# Patient Record
Sex: Female | Born: 1950 | Race: White | Hispanic: No | Marital: Married | Smoking: Never smoker
Health system: Southern US, Community
[De-identification: ages and names within clinical notes are randomized; demographics above are authoritative.]

## PROBLEM LIST (undated history)

## (undated) DIAGNOSIS — I10 Essential (primary) hypertension: Secondary | ICD-10-CM

## (undated) DIAGNOSIS — E785 Hyperlipidemia, unspecified: Secondary | ICD-10-CM

## (undated) DIAGNOSIS — K635 Polyp of colon: Secondary | ICD-10-CM

## (undated) DIAGNOSIS — K219 Gastro-esophageal reflux disease without esophagitis: Secondary | ICD-10-CM

## (undated) DIAGNOSIS — T7840XA Allergy, unspecified, initial encounter: Secondary | ICD-10-CM

## (undated) DIAGNOSIS — K8020 Calculus of gallbladder without cholecystitis without obstruction: Secondary | ICD-10-CM

## (undated) DIAGNOSIS — K5792 Diverticulitis of intestine, part unspecified, without perforation or abscess without bleeding: Secondary | ICD-10-CM

## (undated) DIAGNOSIS — M1990 Unspecified osteoarthritis, unspecified site: Secondary | ICD-10-CM

## (undated) DIAGNOSIS — Z8489 Family history of other specified conditions: Secondary | ICD-10-CM

## (undated) DIAGNOSIS — J189 Pneumonia, unspecified organism: Secondary | ICD-10-CM

## (undated) HISTORY — PX: TUBAL LIGATION: SHX77

## (undated) HISTORY — DX: Allergy, unspecified, initial encounter: T78.40XA

## (undated) HISTORY — DX: Unspecified osteoarthritis, unspecified site: M19.90

## (undated) HISTORY — DX: Polyp of colon: K63.5

## (undated) HISTORY — PX: NECK SURGERY: SHX720

## (undated) HISTORY — PX: POLYPECTOMY: SHX149

## (undated) HISTORY — DX: Gastro-esophageal reflux disease without esophagitis: K21.9

## (undated) HISTORY — PX: CHOLECYSTECTOMY: SHX55

## (undated) HISTORY — DX: Hyperlipidemia, unspecified: E78.5

## (undated) HISTORY — DX: Calculus of gallbladder without cholecystitis without obstruction: K80.20

## (undated) HISTORY — DX: Pneumonia, unspecified organism: J18.9

## (undated) HISTORY — DX: Essential (primary) hypertension: I10

## (undated) HISTORY — PX: CERVICAL SPINE SURGERY: SHX589

## (undated) HISTORY — PX: COLONOSCOPY: SHX174

---

## 1998-03-31 ENCOUNTER — Inpatient Hospital Stay (HOSPITAL_COMMUNITY): Admission: RE | Admit: 1998-03-31 | Discharge: 1998-04-01 | Payer: Self-pay | Admitting: Neurosurgery

## 1999-12-29 ENCOUNTER — Encounter: Payer: Self-pay | Admitting: Gastroenterology

## 1999-12-29 ENCOUNTER — Ambulatory Visit (HOSPITAL_COMMUNITY): Admission: RE | Admit: 1999-12-29 | Discharge: 1999-12-29 | Payer: Self-pay | Admitting: Gastroenterology

## 2000-01-04 ENCOUNTER — Ambulatory Visit (HOSPITAL_COMMUNITY): Admission: RE | Admit: 2000-01-04 | Discharge: 2000-01-04 | Payer: Self-pay | Admitting: Gastroenterology

## 2000-01-04 ENCOUNTER — Encounter (INDEPENDENT_AMBULATORY_CARE_PROVIDER_SITE_OTHER): Payer: Self-pay | Admitting: Specialist

## 2000-01-20 ENCOUNTER — Encounter (INDEPENDENT_AMBULATORY_CARE_PROVIDER_SITE_OTHER): Payer: Self-pay | Admitting: Specialist

## 2000-01-20 ENCOUNTER — Other Ambulatory Visit: Admission: RE | Admit: 2000-01-20 | Discharge: 2000-01-20 | Payer: Self-pay | Admitting: Surgery

## 2001-11-15 ENCOUNTER — Other Ambulatory Visit: Admission: RE | Admit: 2001-11-15 | Discharge: 2001-11-15 | Payer: Self-pay | Admitting: Obstetrics and Gynecology

## 2001-11-22 ENCOUNTER — Ambulatory Visit (HOSPITAL_COMMUNITY): Admission: RE | Admit: 2001-11-22 | Discharge: 2001-11-22 | Payer: Self-pay | Admitting: Obstetrics and Gynecology

## 2001-11-22 ENCOUNTER — Encounter: Payer: Self-pay | Admitting: Obstetrics and Gynecology

## 2003-09-24 ENCOUNTER — Other Ambulatory Visit: Admission: RE | Admit: 2003-09-24 | Discharge: 2003-09-24 | Payer: Self-pay | Admitting: Obstetrics and Gynecology

## 2003-11-02 ENCOUNTER — Ambulatory Visit (HOSPITAL_COMMUNITY): Admission: RE | Admit: 2003-11-02 | Discharge: 2003-11-02 | Payer: Self-pay | Admitting: Obstetrics and Gynecology

## 2004-06-03 ENCOUNTER — Encounter: Admission: RE | Admit: 2004-06-03 | Discharge: 2004-06-03 | Payer: Self-pay | Admitting: Family Medicine

## 2005-02-27 ENCOUNTER — Ambulatory Visit: Payer: Self-pay | Admitting: Family Medicine

## 2005-03-02 ENCOUNTER — Other Ambulatory Visit: Admission: RE | Admit: 2005-03-02 | Discharge: 2005-03-02 | Payer: Self-pay | Admitting: Obstetrics and Gynecology

## 2005-03-20 ENCOUNTER — Ambulatory Visit (HOSPITAL_COMMUNITY): Admission: RE | Admit: 2005-03-20 | Discharge: 2005-03-20 | Payer: Self-pay | Admitting: Obstetrics and Gynecology

## 2006-03-16 ENCOUNTER — Other Ambulatory Visit: Admission: RE | Admit: 2006-03-16 | Discharge: 2006-03-16 | Payer: Self-pay | Admitting: Obstetrics and Gynecology

## 2006-03-21 ENCOUNTER — Ambulatory Visit (HOSPITAL_COMMUNITY): Admission: RE | Admit: 2006-03-21 | Discharge: 2006-03-21 | Payer: Self-pay | Admitting: Obstetrics and Gynecology

## 2007-04-05 ENCOUNTER — Ambulatory Visit (HOSPITAL_COMMUNITY): Admission: RE | Admit: 2007-04-05 | Discharge: 2007-04-05 | Payer: Self-pay | Admitting: Family Medicine

## 2009-04-15 ENCOUNTER — Ambulatory Visit (HOSPITAL_COMMUNITY): Admission: RE | Admit: 2009-04-15 | Discharge: 2009-04-15 | Payer: Self-pay | Admitting: Family Medicine

## 2010-06-13 ENCOUNTER — Ambulatory Visit (HOSPITAL_COMMUNITY): Admission: RE | Admit: 2010-06-13 | Discharge: 2010-06-13 | Payer: Self-pay | Admitting: Obstetrics and Gynecology

## 2011-01-13 NOTE — Procedures (Signed)
[facility]  Patient:    Courtney Schmidt, Courtney Schmidt                        MRN: [id number] Adm. Date:  [id number] Disc. Date: [id number] Attending:  Deneen Harts CC:         Gilford Rile. Benedetto Goad, M.D. [facility]             Velora Heckler, M.D.                           Procedure Report  PROCEDURE:  Panendoscopy with biopsy.  ENDOSCOPIST:  Griffith Citron, M.D.  INDICATION:  [age]-year-old white female, undergoing endoscopy to evaluate epigastric pain.  Evaluation has revealed a chronically inflamed-appearing gallbladder with thickening on ultrasound with biliary sludge.  Undergoing endoscopy to further evaluate epigastric pain prior to anticipated cholecystectomy.  Patient has already seen Dr. Velora Heckler and cholecystectomy has been scheduled.  DESCRIPTION OF PROCEDURE:  After reviewing the nature of the procedure with the patient, including potential risks and complications, and after discussing alternative methods of diagnosis and treatment, informed consent was signed.  Patient was premedicated, receiving IV sedation totalling Versed 5 mg and Fentanyl 50 mcg, administered in divided doses prior to the onset of the procedure.  Using an Olympus videoendoscope, proximal esophagus intubated under direct vision.  Normal oropharynx.  No lesion of the epiglottis, vocal cords or piriform sinus.  The proximal, mid and distal segments of the esophagus were normal.  Mucosal Z-line distinct at 40 cm.  Scope advanced into the stomach. Gastric fundus, body and antrum were normal, pylorus symmetric, duodenal bulb and second portion normal.  Retroflexed view of the angularis, lesser curve, gastric cardia and fundus negative except for one polyp, 5 mm, just beneath the GE junction.  This was biopsied and submitted for routine pathology.  No additional lesion was identified.  Stomach was decompressed; scope withdrawn.  Patient tolerated the procedure without  difficulty, being maintained on digiscope monitor and low-flow oxygen throughout.  Returned to recovery in stable condition.  ASSESSMENT: 1. Gastric fundal polyp -- diminutive, benign, biopsy pending. 2. No lesion to explain abdominal pain, suspect gallbladder etiology.  RECOMMENDATION: 1. Proceed with cholecystectomy. 2. Follow up pathology. DD:  01/04/00 TD:  01/06/00 Job: [id number]

## 2011-05-23 ENCOUNTER — Other Ambulatory Visit (HOSPITAL_COMMUNITY): Payer: Self-pay | Admitting: Family Medicine

## 2011-05-23 DIAGNOSIS — Z1231 Encounter for screening mammogram for malignant neoplasm of breast: Secondary | ICD-10-CM

## 2011-06-19 ENCOUNTER — Ambulatory Visit (HOSPITAL_COMMUNITY)
Admission: RE | Admit: 2011-06-19 | Discharge: 2011-06-19 | Disposition: A | Discharge status: Home / Self Care | Payer: 59 | Source: Ambulatory Visit | Attending: Family Medicine | Admitting: Family Medicine

## 2011-06-19 DIAGNOSIS — Z1231 Encounter for screening mammogram for malignant neoplasm of breast: Secondary | ICD-10-CM | POA: Insufficient documentation

## 2011-11-20 ENCOUNTER — Encounter (INDEPENDENT_AMBULATORY_CARE_PROVIDER_SITE_OTHER): Payer: 59 | Admitting: Obstetrics and Gynecology

## 2011-11-20 DIAGNOSIS — R87610 Atypical squamous cells of undetermined significance on cytologic smear of cervix (ASC-US): Secondary | ICD-10-CM

## 2012-05-15 ENCOUNTER — Encounter: Payer: 59 | Admitting: Obstetrics and Gynecology

## 2012-05-23 ENCOUNTER — Encounter: Payer: Self-pay | Admitting: Obstetrics and Gynecology

## 2012-05-23 ENCOUNTER — Ambulatory Visit (INDEPENDENT_AMBULATORY_CARE_PROVIDER_SITE_OTHER): Payer: 59 | Admitting: Obstetrics and Gynecology

## 2012-05-23 VITALS — BP 110/72 | Ht 62.0 in | Wt 164.0 lb

## 2012-05-23 DIAGNOSIS — N8111 Cystocele, midline: Secondary | ICD-10-CM

## 2012-05-23 DIAGNOSIS — IMO0002 Reserved for concepts with insufficient information to code with codable children: Secondary | ICD-10-CM

## 2012-05-23 DIAGNOSIS — Z124 Encounter for screening for malignant neoplasm of cervix: Secondary | ICD-10-CM

## 2012-05-23 DIAGNOSIS — N800 Endometriosis of uterus: Secondary | ICD-10-CM | POA: Insufficient documentation

## 2012-05-23 DIAGNOSIS — Z01419 Encounter for gynecological examination (general) (routine) without abnormal findings: Secondary | ICD-10-CM

## 2012-05-23 DIAGNOSIS — I10 Essential (primary) hypertension: Secondary | ICD-10-CM | POA: Insufficient documentation

## 2012-05-23 NOTE — Progress Notes (Signed)
The patient is not taking hormone replacement therapy The patient  is taking a Calcium supplement. Post-menopausal bleeding:no  Last Pap: was abnormal: ascus March  2012 Last mammogram: was normal October  2012 Last DEXA scan : T= -0.29 April 2010 Last colonoscopy:normal a year ago per pt   Urinary symptoms: non Normal bowel movements: Yes Reports abuse at home: No:  Pt stated that she has something coming out of her vagina?  Subjective:    Courtney Schmidt is a 61 y.o. female No obstetric history on file. who presents for annual exam.    The following portions of the patient's history were reviewed and updated as appropriate: allergies, current medications, past family history, past medical history, past social history, past surgical history and problem list.  Review of Systems Pertinent items are noted in HPI. Gastrointestinal:No change in bowel habits, no abdominal pain, no rectal bleeding Genitourinary:negative for dysuria, frequency, hematuria, nocturia and urinary incontinence    Objective:     BP 110/72  Ht 5\' 2"  (1.575 m)  Wt 164 lb (74.39 kg)  BMI 30.00 kg/m2  Weight:  Wt Readings from Last 1 Encounters:  05/23/12 164 lb (74.39 kg)     BMI: Body mass index is 30.00 kg/(m^2). General Appearance: Alert, appropriate appearance for age. No acute distress HEENT: Grossly normal Neck / Thyroid: Supple, no masses, nodes or enlargement Lungs: clear to auscultation bilaterally Back: No CVA tenderness Breast Exam: No masses or nodes.No dimpling, nipple retraction or discharge. Cardiovascular: Regular rate and rhythm. S1, S2, no murmur Gastrointestinal: Soft, non-tender, no masses or organomegaly Pelvic Exam: Vulva and vagina appear normal. Bimanual exam reveals normal uterus and adnexa. Cystocele 1/4 Rectovaginal: normal rectal, no masses Lymphatic Exam: Non-palpable nodes in neck, clavicular, axillary, or inguinal regions Skin: no rash or abnormalities Neurologic:  Normal gait and speech, no tremor  Psychiatric: Alert and oriented, appropriate affect.     Assessment:    Normal gyn exam  Mild cystocele   Plan:  reassurance mammogram pap smear return annually or prn Follow-up:  for annual exam

## 2012-05-27 LAB — PAP IG W/ RFLX HPV ASCU

## 2013-05-01 ENCOUNTER — Other Ambulatory Visit (HOSPITAL_COMMUNITY): Payer: Self-pay | Admitting: Family Medicine

## 2013-05-01 DIAGNOSIS — Z1231 Encounter for screening mammogram for malignant neoplasm of breast: Secondary | ICD-10-CM

## 2013-05-07 ENCOUNTER — Ambulatory Visit (HOSPITAL_COMMUNITY)
Admission: RE | Admit: 2013-05-07 | Discharge: 2013-05-07 | Disposition: A | Discharge status: Home / Self Care | Payer: 59 | Source: Ambulatory Visit | Attending: Family Medicine | Admitting: Family Medicine

## 2013-05-07 DIAGNOSIS — Z1231 Encounter for screening mammogram for malignant neoplasm of breast: Secondary | ICD-10-CM | POA: Insufficient documentation

## 2013-10-17 ENCOUNTER — Other Ambulatory Visit: Payer: Self-pay | Admitting: Podiatry

## 2013-10-17 MED ORDER — AMOXICILLIN-POT CLAVULANATE 500-125 MG PO TABS: 1.0000 | ORAL_TABLET | Freq: Three times a day (TID) | ORAL | Status: DC

## 2013-12-04 DIAGNOSIS — M79609 Pain in unspecified limb: Secondary | ICD-10-CM

## 2014-06-29 ENCOUNTER — Encounter: Payer: Self-pay | Admitting: Obstetrics and Gynecology

## 2014-09-10 ENCOUNTER — Other Ambulatory Visit (HOSPITAL_COMMUNITY): Payer: Self-pay | Admitting: Obstetrics and Gynecology

## 2014-09-10 DIAGNOSIS — Z1231 Encounter for screening mammogram for malignant neoplasm of breast: Secondary | ICD-10-CM

## 2014-09-17 ENCOUNTER — Ambulatory Visit (HOSPITAL_COMMUNITY)
Admission: RE | Admit: 2014-09-17 | Discharge: 2014-09-17 | Disposition: A | Discharge status: Home / Self Care | Payer: 59 | Source: Ambulatory Visit | Attending: Obstetrics and Gynecology | Admitting: Obstetrics and Gynecology

## 2014-09-17 DIAGNOSIS — Z1231 Encounter for screening mammogram for malignant neoplasm of breast: Secondary | ICD-10-CM | POA: Insufficient documentation

## 2015-04-29 ENCOUNTER — Telehealth: Payer: Self-pay | Admitting: Internal Medicine

## 2015-04-29 NOTE — Telephone Encounter (Signed)
Received GI records from Dr. Liliane Channel office and forwarded to Dr. Hilarie Fredrickson for review.

## 2015-05-10 ENCOUNTER — Encounter: Payer: Self-pay | Admitting: Internal Medicine

## 2015-07-01 ENCOUNTER — Ambulatory Visit (AMBULATORY_SURGERY_CENTER): Payer: Self-pay | Admitting: *Deleted

## 2015-07-01 VITALS — Ht 62.0 in | Wt 170.0 lb

## 2015-07-01 DIAGNOSIS — Z86010 Personal history of colonic polyps: Secondary | ICD-10-CM

## 2015-07-01 NOTE — Progress Notes (Signed)
No egg or soy allergy No issues with past sedation No diet pills No home 02 use   emmi video to e mail     [email or web address]

## 2015-07-15 ENCOUNTER — Encounter: Payer: Self-pay | Admitting: Internal Medicine

## 2015-07-15 ENCOUNTER — Ambulatory Visit (AMBULATORY_SURGERY_CENTER): Payer: 59 | Admitting: Internal Medicine

## 2015-07-15 VITALS — BP 118/75 | HR 63 | Temp 97.9°F | Resp 18 | Ht 62.0 in | Wt 170.0 lb

## 2015-07-15 DIAGNOSIS — Z86010 Personal history of colonic polyps: Secondary | ICD-10-CM

## 2015-07-15 DIAGNOSIS — D125 Benign neoplasm of sigmoid colon: Secondary | ICD-10-CM

## 2015-07-15 DIAGNOSIS — K635 Polyp of colon: Secondary | ICD-10-CM | POA: Diagnosis not present

## 2015-07-15 MED ORDER — SODIUM CHLORIDE 0.9 % IV SOLN: 500.0000 mL | INTRAVENOUS | Status: DC

## 2015-07-15 NOTE — Op Note (Signed)
[place]  [address] [place], [id number]   COLONOSCOPY PROCEDURE REPORT  PATIENT: Courtney Schmidt, Courtney Schmidt  MR#: [id number] BIRTHDATE: 1950-09-23 , 31  yrs. old GENDER: female ENDOSCOPIST: Jerene Bears, MD REFERRED [id number] Dema Severin, M.D. PROCEDURE DATE:  07/15/2015 PROCEDURE:   Colonoscopy, surveillance and Colonoscopy with snare polypectomy First Screening Colonoscopy - Avg.  risk and is 50 yrs.  old or older - No.  Prior Negative Screening - Now for repeat screening. N/A  [Extent of Exam]  Polyps removed today? Yes ASA CLASS:   Class II INDICATIONS:Surveillance due to prior colonic neoplasia and PH Colon Adenoma. MEDICATIONS: Monitored anesthesia care and Propofol 200 mg IV  DESCRIPTION OF PROCEDURE:   After the risks benefits and alternatives of the procedure were thoroughly explained, informed consent was obtained.  The digital rectal exam revealed no rectal mass.   The LB [id number]  endoscope was introduced through the anus and advanced to the cecum, which was identified by both the appendix and ileocecal valve. No adverse events experienced. The quality of the prep was good.  (MiraLax was used)  The instrument was then slowly withdrawn as the colon was fully examined. Estimated blood loss is zero unless otherwise noted in this procedure report.  COLON FINDINGS: A sessile polyp ranging between 3-70mm in size was found in the sigmoid colon.  A polypectomy was performed with a cold snare.  The resection was complete, the polyp tissue was completely retrieved and sent to histology.   There was mild diverticulosis noted in the sigmoid colon.   The examination was otherwise normal.  Retroflexed views revealed no abnormalities. The time to cecum = 2.7 Withdrawal time = 9.9   The scope was withdrawn and the procedure completed. COMPLICATIONS: There were no immediate complications.  ENDOSCOPIC IMPRESSION: 1.   Sessile polyp ranging between 3-42mm in size was  found in the sigmoid colon; polypectomy was performed with a cold snare 2.   Mild diverticulosis was noted in the sigmoid colon 3.   The examination was otherwise normal  RECOMMENDATIONS: 1.  Await pathology results 2.  High fiber diet 3.  Repeat Colonoscopy in 5 years. 4.  You will receive a letter within 1-2 weeks with the results of your biopsy as well as final recommendations.  Please call my office if you have not received a letter after 3 weeks.  eSigned:  Jerene Bears, MD 07/15/2015 1:44 PM   cc: The Patient and Harlan Stains, MD

## 2015-07-15 NOTE — Progress Notes (Signed)
Called to room to assist during endoscopic procedure.  Patient ID and intended procedure confirmed with present staff. Received instructions for my participation in the procedure from the performing physician.  

## 2015-07-15 NOTE — Progress Notes (Signed)
Report to PACU, RN, vss, BBS= Clear.  

## 2015-07-15 NOTE — Patient Instructions (Signed)
YOU HAD AN ENDOSCOPIC PROCEDURE TODAY AT [facility]:   Refer to the procedure report that was given to you for any specific questions about what was found during the examination.  If the procedure report does not answer your questions, please call your gastroenterologist to clarify.  If you requested that your care partner not be given the details of your procedure findings, then the procedure report has been included in a sealed envelope for you to review at your convenience later.  YOU SHOULD EXPECT: Some feelings of bloating in the abdomen. Passage of more gas than usual.  Walking can help get rid of the air that was put into your GI tract during the procedure and reduce the bloating. If you had a lower endoscopy (such as a colonoscopy or flexible sigmoidoscopy) you may notice spotting of blood in your stool or on the toilet paper. If you underwent a bowel prep for your procedure, you may not have a normal bowel movement for a few days.  Please Note:  You might notice some irritation and congestion in your nose or some drainage.  This is from the oxygen used during your procedure.  There is no need for concern and it should clear up in a day or so.  SYMPTOMS TO REPORT IMMEDIATELY:   Following lower endoscopy (colonoscopy or flexible sigmoidoscopy):  Excessive amounts of blood in the stool  Significant tenderness or worsening of abdominal pains  Swelling of the abdomen that is new, acute  Fever of 100F or higher   For urgent or emergent issues, a gastroenterologist can be reached at any hour by calling [phone].   DIET: Your first meal following the procedure should be a small meal and then it is ok to progress to your normal diet. Heavy or fried foods are harder to digest and may make you feel nauseous or bloated.  Likewise, meals heavy in dairy and vegetables can increase bloating.  Drink plenty of fluids but you should avoid alcoholic beverages for 24  hours.  ACTIVITY:  You should plan to take it easy for the rest of today and you should NOT DRIVE or use heavy machinery until tomorrow (because of the sedation medicines used during the test).    FOLLOW UP: Our staff will call the number listed on your records the next business day following your procedure to check on you and address any questions or concerns that you may have regarding the information given to you following your procedure. If we do not reach you, we will leave a message.  However, if you are feeling well and you are not experiencing any problems, there is no need to return our call.  We will assume that you have returned to your regular daily activities without incident.  If any biopsies were taken you will be contacted by phone or by letter within the next 1-3 weeks.  Please call us at [phone] if you have not heard about the biopsies in 3 weeks.    SIGNATURES/CONFIDENTIALITY: You and/or your care partner have signed paperwork which will be entered into your electronic medical record.  These signatures attest to the fact that that the information above on your After Visit Summary has been reviewed and is understood.  Full responsibility of the confidentiality of this discharge information lies with you and/or your care-partner.  Polyp/ Diverticulosis handout given Await pathology results Repeat Colonoscopy in 5 years

## 2015-07-16 ENCOUNTER — Telehealth: Payer: Self-pay | Admitting: *Deleted

## 2015-07-16 NOTE — Telephone Encounter (Signed)
  Follow up Call-  Call back number 07/15/2015  Post procedure Call Back phone  # [phone]  Permission to leave phone message Yes     Patient questions:  Do you have a fever, pain , or abdominal swelling? No. Pain Score  0 *  Have you tolerated food without any problems? Yes.    Have you been able to return to your normal activities? Yes.    Do you have any questions about your discharge instructions: Diet   No. Medications  No. Follow up visit  No.  Do you have questions or concerns about your Care? No.  Actions: * If pain score is 4 or above: No action needed, pain <4.

## 2015-07-20 ENCOUNTER — Encounter: Payer: Self-pay | Admitting: Internal Medicine

## 2015-08-26 ENCOUNTER — Ambulatory Visit (INDEPENDENT_AMBULATORY_CARE_PROVIDER_SITE_OTHER): Payer: 59 | Admitting: Podiatry

## 2015-08-26 ENCOUNTER — Encounter: Payer: Self-pay | Admitting: Podiatry

## 2015-08-26 DIAGNOSIS — M79605 Pain in left leg: Secondary | ICD-10-CM | POA: Diagnosis not present

## 2015-08-26 DIAGNOSIS — M79606 Pain in leg, unspecified: Secondary | ICD-10-CM | POA: Insufficient documentation

## 2015-08-26 DIAGNOSIS — Q828 Other specified congenital malformations of skin: Secondary | ICD-10-CM | POA: Insufficient documentation

## 2015-08-26 DIAGNOSIS — M21962 Unspecified acquired deformity of left lower leg: Secondary | ICD-10-CM

## 2015-08-26 DIAGNOSIS — M21969 Unspecified acquired deformity of unspecified lower leg: Secondary | ICD-10-CM | POA: Insufficient documentation

## 2015-08-26 NOTE — Patient Instructions (Signed)
Debrided painful calluses. Return as needed. 

## 2015-08-26 NOTE — Progress Notes (Signed)
SUBJECTIVE: 64 y.o. year old female presents complaining of painful callus on left foot.   REVIEW OF SYSTEMS: A comprehensive review of systems was negative.  OBJECTIVE: DERMATOLOGIC EXAMINATION: Porokeratotic lesion under 2nd and 3rd MPJ area left foot, symptomatic. VASCULAR EXAMINATION OF LOWER LIMBS: Pedal pulses: All pedal pulses are palpable with normal pulsation.  Capillary Filling times within 3 seconds in all digits.  Temperature gradient from tibial crest to dorsum of foot is within normal bilateral. NEUROLOGIC EXAMINATION OF THE LOWER LIMBS: All epicritic and tactile sensations grossly intact.  MUSCULOSKELETAL EXAMINATION: Plantar flexed 2nd and 3rd metatarsal left.  ASSESSMENT: Plantar flexed 2nd and 3rd metatarsal left. Porokeratosis left, symptomatic.  PLAN: Debrided all painful lesions left foot. Return as needed.

## 2015-09-08 ENCOUNTER — Other Ambulatory Visit: Payer: Self-pay | Admitting: Podiatry

## 2015-09-08 MED ORDER — CIPROFLOXACIN HCL 500 MG PO TABS: 500.0000 mg | ORAL_TABLET | Freq: Two times a day (BID) | ORAL | Status: DC

## 2015-09-08 MED FILL — CIPROFLOXACIN HCL 500 MG TA: 500 | 10 days supply | Qty: 20 | Fill #0

## 2015-10-14 MED FILL — AMOXICILLIN 500 MG CAPSULE: 500 | 8 days supply | Qty: 30 | Fill #0

## 2015-11-05 DIAGNOSIS — Z1231 Encounter for screening mammogram for malignant neoplasm of breast: Secondary | ICD-10-CM | POA: Diagnosis not present

## 2015-11-05 DIAGNOSIS — Z13820 Encounter for screening for osteoporosis: Secondary | ICD-10-CM | POA: Diagnosis not present

## 2015-11-05 DIAGNOSIS — Z112 Encounter for screening for other bacterial diseases: Secondary | ICD-10-CM | POA: Diagnosis not present

## 2015-11-05 DIAGNOSIS — Z01419 Encounter for gynecological examination (general) (routine) without abnormal findings: Secondary | ICD-10-CM | POA: Diagnosis not present

## 2015-11-05 DIAGNOSIS — N898 Other specified noninflammatory disorders of vagina: Secondary | ICD-10-CM | POA: Diagnosis not present

## 2015-11-05 DIAGNOSIS — Z124 Encounter for screening for malignant neoplasm of cervix: Secondary | ICD-10-CM | POA: Diagnosis not present

## 2015-11-05 DIAGNOSIS — Z6832 Body mass index (BMI) 32.0-32.9, adult: Secondary | ICD-10-CM | POA: Diagnosis not present

## 2015-11-26 DIAGNOSIS — J301 Allergic rhinitis due to pollen: Secondary | ICD-10-CM | POA: Diagnosis not present

## 2015-11-26 DIAGNOSIS — I10 Essential (primary) hypertension: Secondary | ICD-10-CM | POA: Diagnosis not present

## 2015-11-26 MED FILL — FLUTICASONE PROP 50 MCG SPR: 50 | 90 days supply | Qty: 48 | Fill #0

## 2015-11-26 MED FILL — VALSARTAN-HCTZ 320-25 MG TA: 320-25 | 90 days supply | Qty: 90 | Fill #0

## 2015-12-01 DIAGNOSIS — R8290 Unspecified abnormal findings in urine: Secondary | ICD-10-CM | POA: Diagnosis not present

## 2015-12-01 DIAGNOSIS — N764 Abscess of vulva: Secondary | ICD-10-CM | POA: Diagnosis not present

## 2015-12-01 MED FILL — CEPHALEXIN 500 MG CAPSULE: 500 | 7 days supply | Qty: 21 | Fill #0

## 2016-03-03 MED FILL — VALSARTAN-HCTZ 320-25 MG TA: 320-25 | 90 days supply | Qty: 90 | Fill #1

## 2016-03-10 ENCOUNTER — Ambulatory Visit (INDEPENDENT_AMBULATORY_CARE_PROVIDER_SITE_OTHER): Payer: 59 | Admitting: Podiatry

## 2016-03-10 DIAGNOSIS — Q828 Other specified congenital malformations of skin: Secondary | ICD-10-CM | POA: Diagnosis not present

## 2016-03-10 DIAGNOSIS — M21962 Unspecified acquired deformity of left lower leg: Secondary | ICD-10-CM | POA: Diagnosis not present

## 2016-03-10 DIAGNOSIS — M79605 Pain in left leg: Secondary | ICD-10-CM

## 2016-03-13 ENCOUNTER — Encounter: Payer: Self-pay | Admitting: Podiatry

## 2016-03-13 NOTE — Patient Instructions (Signed)
Seen for painful feet. All calluses debrided. Both fee casted for orthotics.

## 2016-03-13 NOTE — Progress Notes (Signed)
SUBJECTIVE: 65 y.o. year old female presents complaining of painful on left foot.   REVIEW OF SYSTEMS: A comprehensive review of systems was negative.  OBJECTIVE: DERMATOLOGIC EXAMINATION: Porokeratotic lesion under 2nd and 3rd MPJ area left foot, symptomatic. VASCULAR EXAMINATION OF LOWER LIMBS: Pedal pulses: All pedal pulses are palpable with normal pulsation.  Capillary Filling times within 3 seconds in all digits.  Temperature gradient from tibial crest to dorsum of foot is within normal bilateral. NEUROLOGIC EXAMINATION OF THE LOWER LIMBS: All epicritic and tactile sensations grossly intact.  MUSCULOSKELETAL EXAMINATION: Plantar flexed 2nd and 3rd metatarsal left.  ASSESSMENT: Plantar flexed 2nd and 3rd metatarsal left. Porokeratosis left, symptomatic.  PLAN: Debrided all painful lesions left foot. Both feet casted for Orthotics. Return as needed.

## 2016-05-23 DIAGNOSIS — M79673 Pain in unspecified foot: Secondary | ICD-10-CM

## 2016-06-06 MED FILL — VALSARTAN-HCTZ 320-25 MG TA: 320-25 | 90 days supply | Qty: 90 | Fill #0

## 2016-06-08 DIAGNOSIS — K5732 Diverticulitis of large intestine without perforation or abscess without bleeding: Secondary | ICD-10-CM | POA: Diagnosis not present

## 2016-06-08 DIAGNOSIS — R1032 Left lower quadrant pain: Secondary | ICD-10-CM | POA: Diagnosis not present

## 2016-06-08 MED FILL — CIPROFLOXACIN HCL 500 MG TA: 500 | 10 days supply | Qty: 20 | Fill #0

## 2016-06-08 MED FILL — metroNIDAZOLE 500 MG TABS: 500 | 10 days supply | Qty: 30 | Fill #0

## 2016-07-14 ENCOUNTER — Ambulatory Visit (HOSPITAL_COMMUNITY)
Admission: RE | Admit: 2016-07-14 | Discharge: 2016-07-14 | Disposition: A | Discharge status: Home / Self Care | Payer: 59 | Source: Ambulatory Visit | Attending: Family Medicine | Admitting: Family Medicine

## 2016-07-14 ENCOUNTER — Other Ambulatory Visit (HOSPITAL_COMMUNITY): Payer: Self-pay | Admitting: Family Medicine

## 2016-07-14 DIAGNOSIS — I7090 Unspecified atherosclerosis: Secondary | ICD-10-CM | POA: Insufficient documentation

## 2016-07-14 DIAGNOSIS — Z0000 Encounter for general adult medical examination without abnormal findings: Secondary | ICD-10-CM | POA: Diagnosis not present

## 2016-07-14 DIAGNOSIS — K5730 Diverticulosis of large intestine without perforation or abscess without bleeding: Secondary | ICD-10-CM | POA: Insufficient documentation

## 2016-07-14 DIAGNOSIS — N760 Acute vaginitis: Secondary | ICD-10-CM | POA: Diagnosis not present

## 2016-07-14 DIAGNOSIS — I10 Essential (primary) hypertension: Secondary | ICD-10-CM | POA: Diagnosis not present

## 2016-07-14 DIAGNOSIS — R1032 Left lower quadrant pain: Secondary | ICD-10-CM

## 2016-07-14 DIAGNOSIS — Z23 Encounter for immunization: Secondary | ICD-10-CM | POA: Diagnosis not present

## 2016-07-14 LAB — CREATININE, SERUM
Creatinine, Ser: 0.74 mg/dL (ref 0.44–1.00)
GFR calc Af Amer: >60 mL/min (ref >60)
GFR calc non Af Amer: >60 mL/min (ref >60)

## 2016-07-14 LAB — BUN: BUN: 14 mg/dL (ref 6–20)

## 2016-07-14 MED ORDER — IOPAMIDOL (ISOVUE-300) INJECTION 61%
100.0000 mL | Freq: Once | INTRAVENOUS | Status: AC | PRN
  Administered 2016-07-14: 100 mL via INTRAVENOUS

## 2016-07-14 MED FILL — MUPIROCIN 2% OINTMENT: 2 | 10 days supply | Qty: 22 | Fill #0

## 2016-07-17 MED FILL — AMOXICILLIN 875 MG TABLET: 875 | 7 days supply | Qty: 14 | Fill #0

## 2016-07-18 ENCOUNTER — Encounter: Payer: Self-pay | Admitting: Podiatry

## 2016-07-18 ENCOUNTER — Ambulatory Visit (INDEPENDENT_AMBULATORY_CARE_PROVIDER_SITE_OTHER): Payer: 59 | Admitting: Podiatry

## 2016-07-18 VITALS — BP 116/72 | HR 70 | Ht 61.0 in | Wt 171.0 lb

## 2016-07-18 DIAGNOSIS — M21962 Unspecified acquired deformity of left lower leg: Secondary | ICD-10-CM

## 2016-07-18 DIAGNOSIS — Q828 Other specified congenital malformations of skin: Secondary | ICD-10-CM | POA: Diagnosis not present

## 2016-07-18 DIAGNOSIS — M79605 Pain in left leg: Secondary | ICD-10-CM | POA: Diagnosis not present

## 2016-07-18 NOTE — Patient Instructions (Signed)
Seen for painful lesion left foot. All keratotic lesions debrided. Return as needed.

## 2016-07-18 NOTE — Progress Notes (Signed)
SUBJECTIVE: 65 y.o. year old female presents complaining of painful on left foot from build up callus.   REVIEW OF SYSTEMS: A comprehensive review of systems was negative.  OBJECTIVE: DERMATOLOGIC EXAMINATION: Porokeratotic lesion under 2nd and 3rd MPJ area left foot, symptomatic with weight bearing. Mild broad callus under 2nd MPJ right foot. VASCULAR EXAMINATION OF LOWER LIMBS: All pedal pulses are palpable with normal pulsation.  Capillary Filling times within 3 seconds in all digits.  Temperature gradient from tibial crest to dorsum of foot is within normal bilateral. NEUROLOGIC EXAMINATION OF THE LOWER LIMBS: All epicritic and tactile sensations grossly intact.  MUSCULOSKELETAL EXAMINATION: Plantar flexed 2nd and 3rd metatarsal left.  ASSESSMENT: Plantar flexed 2nd and 3rd metatarsal left. Porokeratosis left, symptomatic.  PLAN: Debrided all painful lesions left and right foot. Pain was relieved. Return as needed.

## 2016-09-04 MED FILL — VALSARTAN-HCTZ 320-25 MG TA: 320-25 | 90 days supply | Qty: 90 | Fill #0

## 2016-10-29 ENCOUNTER — Emergency Department (HOSPITAL_BASED_OUTPATIENT_CLINIC_OR_DEPARTMENT_OTHER)
Admission: EM | Admit: 2016-10-29 | Discharge: 2016-10-29 | Disposition: A | Discharge status: Home / Self Care | Payer: 59 | Attending: Physician Assistant | Admitting: Physician Assistant

## 2016-10-29 ENCOUNTER — Emergency Department (HOSPITAL_BASED_OUTPATIENT_CLINIC_OR_DEPARTMENT_OTHER): Payer: 59

## 2016-10-29 ENCOUNTER — Encounter (HOSPITAL_BASED_OUTPATIENT_CLINIC_OR_DEPARTMENT_OTHER): Payer: Self-pay | Admitting: Respiratory Therapy

## 2016-10-29 DIAGNOSIS — Z79899 Other long term (current) drug therapy: Secondary | ICD-10-CM | POA: Insufficient documentation

## 2016-10-29 DIAGNOSIS — J069 Acute upper respiratory infection, unspecified: Secondary | ICD-10-CM | POA: Insufficient documentation

## 2016-10-29 DIAGNOSIS — I10 Essential (primary) hypertension: Secondary | ICD-10-CM | POA: Insufficient documentation

## 2016-10-29 DIAGNOSIS — B9789 Other viral agents as the cause of diseases classified elsewhere: Secondary | ICD-10-CM

## 2016-10-29 DIAGNOSIS — R05 Cough: Secondary | ICD-10-CM | POA: Diagnosis not present

## 2016-10-29 HISTORY — DX: Diverticulitis of intestine, part unspecified, without perforation or abscess without bleeding: K57.92

## 2016-10-29 LAB — RAPID STREP SCREEN (MED CTR MEBANE ONLY): Streptococcus, Group A Screen (Direct): NEGATIVE

## 2016-10-29 MED ORDER — ONDANSETRON 4 MG PO TBDP: 4.0000 mg | ORAL_TABLET | Freq: Three times a day (TID) | ORAL | 0 refills | Status: DC | PRN

## 2016-10-29 MED ORDER — HYDROCOD POLST-CPM POLST ER 10-8 MG/5ML PO SUER: 5.0000 mL | Freq: Two times a day (BID) | ORAL | 0 refills | Status: DC | PRN

## 2016-10-29 NOTE — ED Provider Notes (Signed)
[facility] Provider Note   CSN: [place]:[id number] Arrival date & time: 10/29/16  0859     History   Chief Complaint Chief Complaint  Patient presents with  . URI    HPI Courtney Schmidt is a 66 y.o. female.  The history is provided by the patient and medical records.  URI   Associated symptoms include congestion, sore throat and cough.    66 year old female with history of seasonal allergies, arthritis, hypertension, presenting to the ED for URI type symptoms for the past 4 days. Patient reports specifically she has had nasal congestion, postnasal drip, mild headache, an intermittently productive cough. She reports subjective fever at night. She denies any chest pain or shortness of breath. No nausea or vomiting. No abdominal pain. States she feels like her symptoms are getting worse. She has tried taking mucinex as well as Tylenol cold and sinus without relief.  Did receive flu shot this year.  Past Medical History:  Diagnosis Date  . Allergy   . Arthritis   . Diverticulitis   . Hypertension     Patient Active Problem List   Diagnosis Date Noted  . 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 Porokeratosis 08/26/2015  . Pain in lower limb 08/26/2015  . HTN (hypertension) 05/23/2012  . Adenomyosis 05/23/2012    Past Surgical History:  Procedure Laterality Date  . CHOLECYSTECTOMY    . COLONOSCOPY  [id number]  . NECK SURGERY  [phone]   1999 fusion  . POLYPECTOMY  [id number]   h xTA polyps  . TUBAL LIGATION      OB History    Gravida Para Term Preterm AB Living   3 2     1      SAB TAB Ectopic Multiple Live Births   1               Home Medications    Prior to Admission medications   Medication Sig Start Date End Date Taking? Authorizing Provider  loratadine (CLARITIN) 10 MG tablet Take 10 mg by mouth daily.   Yes Historical Provider, MD  valsartan-hydrochlorothiazide (DIOVAN HCT) 320-25 MG tablet Take 1 tablet by mouth daily.   Yes Historical Provider, MD    ciprofloxacin (CIPRO) 500 MG tablet Take 1 tablet (500 mg total) by mouth 2 (two) times daily. 09/08/15   Myeong Roxine Caddy, DPM  mupirocin ointment (BACTROBAN) 2 %  07/14/16   Historical Provider, MD    Family History Family History  Problem Relation Age of Onset  . Aneurysm Mother   . Pancreatic cancer Father   . Breast cancer Cousin   . Colon polyps Sister   . Colon cancer Neg Hx   . Rectal cancer Neg Hx   . Stomach cancer Neg Hx   . Esophageal cancer Neg Hx     Social History Social History  Substance Use Topics  . Smoking status: Never Smoker  . Smokeless tobacco: Never Used  . Alcohol use No     Allergies   Codeine; E-mycin [erythromycin]; and Sulfa antibiotics   Review of Systems Review of Systems  HENT: Positive for congestion and sore throat.   Respiratory: Positive for cough.   All other systems reviewed and are negative.    Physical Exam Updated Vital Signs BP 121/86 (BP Location: Right Arm)   Pulse 83   Temp 98 F (36.7 C) (Oral)   Resp 20   Ht 5\' 2"  (1.575 m)   Wt 78.9 kg   SpO2 98%   BMI  31.83 kg/m   Physical Exam  Constitutional: She is oriented to person, place, and time. She appears well-developed and well-nourished.  Appears well  HENT:  Head: Normocephalic and atraumatic.  Right Ear: Tympanic membrane and ear canal normal.  Left Ear: Tympanic membrane and ear canal normal.  Nose: Mucosal edema present.  Mouth/Throat: Uvula is midline, oropharynx is clear and moist and mucous membranes are normal.  + nasal congestion with PND Tonsils overall normal in appearance bilaterally without exudate; uvula midline without evidence of peritonsillar abscess; handling secretions appropriately; no difficulty swallowing or speaking; normal phonation without stridor  Eyes: Conjunctivae and EOM are normal. Pupils are equal, round, and reactive to light.  Neck: Normal range of motion.  Cardiovascular: Normal rate, regular rhythm and normal heart sounds.    Pulmonary/Chest: Effort normal and breath sounds normal. No respiratory distress. She has no wheezes. She has no rhonchi.  Lungs clear, no wheezes or rhonchi, no distress, speaking in full sentences without difficulty  Abdominal: Soft. Bowel sounds are normal. There is no tenderness. There is no rebound.  Musculoskeletal: Normal range of motion.  Neurological: She is alert and oriented to person, place, and time.  Skin: Skin is warm and dry.  Psychiatric: She has a normal mood and affect.  Nursing note and vitals reviewed.    ED Treatments / Results  Labs (all labs ordered are listed, but only abnormal results are displayed) Labs Reviewed  RAPID STREP SCREEN (NOT AT [facility])  CULTURE, GROUP A STREP [facility])    EKG  EKG Interpretation None       Radiology Dg Chest 2 View  Result Date: 10/29/2016 CLINICAL DATA:  Nonproductive cough. EXAM: CHEST  2 VIEW COMPARISON:  None. FINDINGS: The heart size and mediastinal contours are within normal limits. Both lungs are clear. No pneumothorax or pleural effusion is noted. The visualized skeletal structures are unremarkable. IMPRESSION: No active cardiopulmonary disease. Electronically Signed   By: Marijo Conception, M.D.   On: 10/29/2016 10:17    Procedures Procedures (including critical care time)  Medications Ordered in ED Medications - No data to display   Initial Impression / Assessment and Plan / ED Course  I have reviewed the triage vital signs and the nursing notes.  Pertinent labs & imaging results that were available during my care of the patient were reviewed by me and considered in my medical decision making (see chart for details).  66 year old female here with URI type symptoms for the past 4 days. She is afebrile and nontoxic. Exam is overall benign aside from some nasal congestion and postnasal drip. Rapid strep was sent and is negative, culture pending. Chest x-ray is clear. Patient continues to appear well with stable vital  signs. She does not appear toxic. Suspect this is a viral process. Will discharge home with supportive care, given cough syrup and Zofran as she reports this sometimes makes her nauseated. Rest, oral hydration.  Recommended close follow-up with PCP.  Discussed plan with patient, she acknowledged understanding and agreed with plan of care.  Return precautions given for new or worsening symptoms.  Final Clinical Impressions(s) / ED Diagnoses   Final diagnoses:  Viral URI with cough    New Prescriptions New Prescriptions   CHLORPHENIRAMINE-HYDROCODONE (TUSSIONEX PENNKINETIC ER) 10-8 MG/5ML SUER    Take 5 mLs by mouth every 12 (twelve) hours as needed for cough.   ONDANSETRON (ZOFRAN ODT) 4 MG DISINTEGRATING TABLET    Take 1 tablet (4 mg total) by mouth every  8 (eight) hours as needed for nausea.     Larene Pickett, PA-C 10/29/16 [place], MD 10/29/16 [phone]

## 2016-10-29 NOTE — ED Triage Notes (Signed)
Patient states she has a four day history of sore throat, which has progressively gotten worse.  Symptoms are associated with headache, non productive cough and generalized body aches.

## 2016-10-29 NOTE — Discharge Instructions (Signed)
Take the prescribed medication as directed.  Use caution with the cough syrup, it can make you sleepy. Follow-up with your primary care doctor. Return to the ED for new or worsening symptoms.

## 2016-11-01 LAB — CULTURE, GROUP A STREP (THRC)

## 2016-11-01 MED FILL — HYDROCODONE-CHLORPHENIRAM S: 10-8 | 10 days supply | Qty: 100 | Fill #0

## 2016-11-01 MED FILL — ONDANSETRON ODT 4 MG TABLET: 4 | 4 days supply | Qty: 10 | Fill #0

## 2016-11-03 DIAGNOSIS — J209 Acute bronchitis, unspecified: Secondary | ICD-10-CM | POA: Diagnosis not present

## 2016-11-03 DIAGNOSIS — J069 Acute upper respiratory infection, unspecified: Secondary | ICD-10-CM | POA: Diagnosis not present

## 2016-11-03 MED FILL — AZITHROMYCIN 250 MG TABLET: 250 | 5 days supply | Qty: 6 | Fill #0

## 2016-11-17 MED FILL — FLUTICASONE PROP 50 MCG SPR: 50 | 90 days supply | Qty: 48 | Fill #1

## 2016-12-05 MED FILL — VALSARTAN-HCTZ 320-25 MG TA: 320-25 | 90 days supply | Qty: 90 | Fill #1

## 2017-01-26 DIAGNOSIS — H903 Sensorineural hearing loss, bilateral: Secondary | ICD-10-CM | POA: Diagnosis not present

## 2017-03-05 DIAGNOSIS — J309 Allergic rhinitis, unspecified: Secondary | ICD-10-CM | POA: Diagnosis not present

## 2017-03-05 DIAGNOSIS — I10 Essential (primary) hypertension: Secondary | ICD-10-CM | POA: Diagnosis not present

## 2017-03-05 MED FILL — FLUTICASONE PROP 50 MCG SPR: 50 | 90 days supply | Qty: 48 | Fill #0

## 2017-03-05 MED FILL — VALSARTAN-HCTZ 320-25 MG TA: 320-25 | 90 days supply | Qty: 90 | Fill #0

## 2017-04-20 DIAGNOSIS — H903 Sensorineural hearing loss, bilateral: Secondary | ICD-10-CM | POA: Diagnosis not present

## 2017-05-17 DIAGNOSIS — M21962 Unspecified acquired deformity of left lower leg: Secondary | ICD-10-CM

## 2017-06-04 MED FILL — VALSARTAN-HCTZ 320-25 MG TA: 320-25 | 90 days supply | Qty: 90 | Fill #1

## 2017-07-26 ENCOUNTER — Encounter: Payer: Self-pay | Admitting: Podiatry

## 2017-07-26 ENCOUNTER — Ambulatory Visit (INDEPENDENT_AMBULATORY_CARE_PROVIDER_SITE_OTHER): Payer: 59 | Admitting: Podiatry

## 2017-07-26 DIAGNOSIS — M21962 Unspecified acquired deformity of left lower leg: Secondary | ICD-10-CM

## 2017-07-26 DIAGNOSIS — Q828 Other specified congenital malformations of skin: Secondary | ICD-10-CM

## 2017-07-26 DIAGNOSIS — M79605 Pain in left leg: Secondary | ICD-10-CM

## 2017-07-26 NOTE — Progress Notes (Signed)
Painful callus under 2nd MPJ right. Painful callus debrided. Return as needed.

## 2017-07-26 NOTE — Patient Instructions (Addendum)
Painful callus debrided. Return as needed.

## 2017-08-16 DIAGNOSIS — M21962 Unspecified acquired deformity of left lower leg: Secondary | ICD-10-CM

## 2017-08-20 MED FILL — VALSARTAN-HCTZ 320-25 MG TA: 320-25 | 90 days supply | Qty: 90 | Fill #0

## 2017-09-10 DIAGNOSIS — Z23 Encounter for immunization: Secondary | ICD-10-CM | POA: Diagnosis not present

## 2017-09-10 DIAGNOSIS — I10 Essential (primary) hypertension: Secondary | ICD-10-CM | POA: Diagnosis not present

## 2018-02-22 DIAGNOSIS — R109 Unspecified abdominal pain: Secondary | ICD-10-CM | POA: Diagnosis not present

## 2018-02-22 DIAGNOSIS — N390 Urinary tract infection, site not specified: Secondary | ICD-10-CM | POA: Diagnosis not present

## 2018-02-22 DIAGNOSIS — J069 Acute upper respiratory infection, unspecified: Secondary | ICD-10-CM | POA: Diagnosis not present

## 2018-04-05 DIAGNOSIS — E785 Hyperlipidemia, unspecified: Secondary | ICD-10-CM | POA: Diagnosis not present

## 2018-04-05 DIAGNOSIS — I10 Essential (primary) hypertension: Secondary | ICD-10-CM | POA: Diagnosis not present

## 2018-04-05 DIAGNOSIS — I700 Atherosclerosis of aorta: Secondary | ICD-10-CM | POA: Diagnosis not present

## 2018-04-05 DIAGNOSIS — J309 Allergic rhinitis, unspecified: Secondary | ICD-10-CM | POA: Diagnosis not present

## 2018-04-05 DIAGNOSIS — Z0000 Encounter for general adult medical examination without abnormal findings: Secondary | ICD-10-CM | POA: Diagnosis not present

## 2018-04-05 DIAGNOSIS — Z23 Encounter for immunization: Secondary | ICD-10-CM | POA: Diagnosis not present

## 2018-04-05 DIAGNOSIS — N289 Disorder of kidney and ureter, unspecified: Secondary | ICD-10-CM | POA: Diagnosis not present

## 2018-05-20 DIAGNOSIS — Z0100 Encounter for examination of eyes and vision without abnormal findings: Secondary | ICD-10-CM | POA: Diagnosis not present

## 2018-05-20 DIAGNOSIS — H524 Presbyopia: Secondary | ICD-10-CM | POA: Diagnosis not present

## 2018-05-30 DIAGNOSIS — Z23 Encounter for immunization: Secondary | ICD-10-CM | POA: Diagnosis not present

## 2018-08-08 DIAGNOSIS — K5792 Diverticulitis of intestine, part unspecified, without perforation or abscess without bleeding: Secondary | ICD-10-CM | POA: Diagnosis not present

## 2018-08-08 DIAGNOSIS — R300 Dysuria: Secondary | ICD-10-CM | POA: Diagnosis not present

## 2018-10-08 DIAGNOSIS — I10 Essential (primary) hypertension: Secondary | ICD-10-CM | POA: Diagnosis not present

## 2018-10-08 DIAGNOSIS — N289 Disorder of kidney and ureter, unspecified: Secondary | ICD-10-CM | POA: Diagnosis not present

## 2019-04-15 DIAGNOSIS — E785 Hyperlipidemia, unspecified: Secondary | ICD-10-CM | POA: Diagnosis not present

## 2019-04-15 DIAGNOSIS — I10 Essential (primary) hypertension: Secondary | ICD-10-CM | POA: Diagnosis not present

## 2019-04-15 DIAGNOSIS — I700 Atherosclerosis of aorta: Secondary | ICD-10-CM | POA: Diagnosis not present

## 2019-04-15 DIAGNOSIS — Z0000 Encounter for general adult medical examination without abnormal findings: Secondary | ICD-10-CM | POA: Diagnosis not present

## 2019-06-16 DIAGNOSIS — E785 Hyperlipidemia, unspecified: Secondary | ICD-10-CM | POA: Diagnosis not present

## 2019-06-16 DIAGNOSIS — Z23 Encounter for immunization: Secondary | ICD-10-CM | POA: Diagnosis not present

## 2019-06-16 DIAGNOSIS — I10 Essential (primary) hypertension: Secondary | ICD-10-CM | POA: Diagnosis not present

## 2019-06-18 DIAGNOSIS — H5203 Hypermetropia, bilateral: Secondary | ICD-10-CM | POA: Diagnosis not present

## 2019-06-18 DIAGNOSIS — H25813 Combined forms of age-related cataract, bilateral: Secondary | ICD-10-CM | POA: Diagnosis not present

## 2019-06-19 ENCOUNTER — Other Ambulatory Visit: Payer: Self-pay

## 2019-06-19 DIAGNOSIS — Z20822 Contact with and (suspected) exposure to covid-19: Secondary | ICD-10-CM

## 2019-06-21 LAB — NOVEL CORONAVIRUS, NAA: SARS-CoV-2, NAA: NOT DETECTED

## 2019-12-19 ENCOUNTER — Other Ambulatory Visit: Payer: Self-pay | Admitting: Family Medicine

## 2019-12-19 DIAGNOSIS — Z1231 Encounter for screening mammogram for malignant neoplasm of breast: Secondary | ICD-10-CM

## 2019-12-29 ENCOUNTER — Ambulatory Visit: Payer: Self-pay

## 2020-01-21 DIAGNOSIS — L237 Allergic contact dermatitis due to plants, except food: Secondary | ICD-10-CM | POA: Diagnosis not present

## 2020-01-28 DIAGNOSIS — L237 Allergic contact dermatitis due to plants, except food: Secondary | ICD-10-CM | POA: Diagnosis not present

## 2020-01-28 DIAGNOSIS — I10 Essential (primary) hypertension: Secondary | ICD-10-CM | POA: Diagnosis not present

## 2020-01-29 ENCOUNTER — Other Ambulatory Visit: Payer: Self-pay

## 2020-01-29 ENCOUNTER — Ambulatory Visit
Admission: RE | Admit: 2020-01-29 | Discharge: 2020-01-29 | Disposition: A | Discharge status: Home / Self Care | Payer: Medicare HMO | Source: Ambulatory Visit | Attending: Family Medicine | Admitting: Family Medicine

## 2020-01-29 DIAGNOSIS — Z1231 Encounter for screening mammogram for malignant neoplasm of breast: Secondary | ICD-10-CM

## 2020-04-14 DIAGNOSIS — E0590 Thyrotoxicosis, unspecified without thyrotoxic crisis or storm: Secondary | ICD-10-CM | POA: Diagnosis not present

## 2020-04-14 DIAGNOSIS — R251 Tremor, unspecified: Secondary | ICD-10-CM | POA: Diagnosis not present

## 2020-04-14 DIAGNOSIS — R200 Anesthesia of skin: Secondary | ICD-10-CM | POA: Diagnosis not present

## 2020-04-14 DIAGNOSIS — M542 Cervicalgia: Secondary | ICD-10-CM | POA: Diagnosis not present

## 2020-04-14 DIAGNOSIS — M24811 Other specific joint derangements of right shoulder, not elsewhere classified: Secondary | ICD-10-CM | POA: Diagnosis not present

## 2020-04-14 DIAGNOSIS — I10 Essential (primary) hypertension: Secondary | ICD-10-CM | POA: Diagnosis not present

## 2020-04-14 DIAGNOSIS — Z79899 Other long term (current) drug therapy: Secondary | ICD-10-CM | POA: Diagnosis not present

## 2020-04-14 DIAGNOSIS — R079 Chest pain, unspecified: Secondary | ICD-10-CM | POA: Diagnosis not present

## 2020-04-14 DIAGNOSIS — E876 Hypokalemia: Secondary | ICD-10-CM | POA: Diagnosis not present

## 2020-04-14 DIAGNOSIS — Z6832 Body mass index (BMI) 32.0-32.9, adult: Secondary | ICD-10-CM | POA: Diagnosis not present

## 2020-04-14 DIAGNOSIS — E669 Obesity, unspecified: Secondary | ICD-10-CM | POA: Diagnosis not present

## 2020-04-14 DIAGNOSIS — M5412 Radiculopathy, cervical region: Secondary | ICD-10-CM | POA: Diagnosis not present

## 2020-04-15 DIAGNOSIS — M50321 Other cervical disc degeneration at C4-C5 level: Secondary | ICD-10-CM | POA: Diagnosis not present

## 2020-04-15 DIAGNOSIS — R200 Anesthesia of skin: Secondary | ICD-10-CM | POA: Diagnosis not present

## 2020-04-15 DIAGNOSIS — M5412 Radiculopathy, cervical region: Secondary | ICD-10-CM | POA: Diagnosis not present

## 2020-04-15 DIAGNOSIS — R079 Chest pain, unspecified: Secondary | ICD-10-CM | POA: Diagnosis not present

## 2020-04-15 DIAGNOSIS — M47812 Spondylosis without myelopathy or radiculopathy, cervical region: Secondary | ICD-10-CM | POA: Diagnosis not present

## 2020-04-15 DIAGNOSIS — E669 Obesity, unspecified: Secondary | ICD-10-CM | POA: Diagnosis not present

## 2020-04-15 DIAGNOSIS — I10 Essential (primary) hypertension: Secondary | ICD-10-CM | POA: Diagnosis not present

## 2020-04-15 DIAGNOSIS — Z981 Arthrodesis status: Secondary | ICD-10-CM | POA: Diagnosis not present

## 2020-04-15 DIAGNOSIS — E0590 Thyrotoxicosis, unspecified without thyrotoxic crisis or storm: Secondary | ICD-10-CM | POA: Diagnosis not present

## 2020-04-15 DIAGNOSIS — E785 Hyperlipidemia, unspecified: Secondary | ICD-10-CM | POA: Diagnosis not present

## 2020-04-16 DIAGNOSIS — M542 Cervicalgia: Secondary | ICD-10-CM | POA: Diagnosis not present

## 2020-04-16 DIAGNOSIS — M5412 Radiculopathy, cervical region: Secondary | ICD-10-CM | POA: Diagnosis not present

## 2020-04-19 DIAGNOSIS — M5030 Other cervical disc degeneration, unspecified cervical region: Secondary | ICD-10-CM | POA: Diagnosis not present

## 2020-04-19 DIAGNOSIS — Z6832 Body mass index (BMI) 32.0-32.9, adult: Secondary | ICD-10-CM | POA: Diagnosis not present

## 2020-04-19 DIAGNOSIS — M79641 Pain in right hand: Secondary | ICD-10-CM | POA: Diagnosis not present

## 2020-04-19 DIAGNOSIS — M5412 Radiculopathy, cervical region: Secondary | ICD-10-CM | POA: Diagnosis not present

## 2020-04-21 ENCOUNTER — Other Ambulatory Visit: Payer: Self-pay | Admitting: Family Medicine

## 2020-04-21 DIAGNOSIS — M5412 Radiculopathy, cervical region: Secondary | ICD-10-CM

## 2020-04-23 ENCOUNTER — Ambulatory Visit
Admission: RE | Admit: 2020-04-23 | Discharge: 2020-04-23 | Disposition: A | Discharge status: Home / Self Care | Payer: Medicare HMO | Source: Ambulatory Visit | Attending: Family Medicine | Admitting: Family Medicine

## 2020-04-23 ENCOUNTER — Other Ambulatory Visit: Payer: Self-pay

## 2020-04-23 DIAGNOSIS — I700 Atherosclerosis of aorta: Secondary | ICD-10-CM | POA: Diagnosis not present

## 2020-04-23 DIAGNOSIS — R7301 Impaired fasting glucose: Secondary | ICD-10-CM | POA: Diagnosis not present

## 2020-04-23 DIAGNOSIS — M5412 Radiculopathy, cervical region: Secondary | ICD-10-CM

## 2020-04-23 DIAGNOSIS — M4802 Spinal stenosis, cervical region: Secondary | ICD-10-CM | POA: Diagnosis not present

## 2020-04-23 DIAGNOSIS — E785 Hyperlipidemia, unspecified: Secondary | ICD-10-CM | POA: Diagnosis not present

## 2020-04-23 DIAGNOSIS — Z0000 Encounter for general adult medical examination without abnormal findings: Secondary | ICD-10-CM | POA: Diagnosis not present

## 2020-04-23 DIAGNOSIS — J309 Allergic rhinitis, unspecified: Secondary | ICD-10-CM | POA: Diagnosis not present

## 2020-04-23 DIAGNOSIS — I10 Essential (primary) hypertension: Secondary | ICD-10-CM | POA: Diagnosis not present

## 2020-04-23 DIAGNOSIS — R946 Abnormal results of thyroid function studies: Secondary | ICD-10-CM | POA: Diagnosis not present

## 2020-04-23 DIAGNOSIS — M5030 Other cervical disc degeneration, unspecified cervical region: Secondary | ICD-10-CM | POA: Diagnosis not present

## 2020-04-26 DIAGNOSIS — M79641 Pain in right hand: Secondary | ICD-10-CM | POA: Diagnosis not present

## 2020-04-26 DIAGNOSIS — Z6831 Body mass index (BMI) 31.0-31.9, adult: Secondary | ICD-10-CM | POA: Diagnosis not present

## 2020-04-26 DIAGNOSIS — M5412 Radiculopathy, cervical region: Secondary | ICD-10-CM | POA: Diagnosis not present

## 2020-04-30 DIAGNOSIS — R200 Anesthesia of skin: Secondary | ICD-10-CM | POA: Diagnosis not present

## 2020-05-12 DIAGNOSIS — M5416 Radiculopathy, lumbar region: Secondary | ICD-10-CM | POA: Diagnosis not present

## 2020-05-17 ENCOUNTER — Other Ambulatory Visit: Payer: Self-pay | Admitting: Neurological Surgery

## 2020-05-17 DIAGNOSIS — M5020 Other cervical disc displacement, unspecified cervical region: Secondary | ICD-10-CM | POA: Diagnosis not present

## 2020-05-17 DIAGNOSIS — M5412 Radiculopathy, cervical region: Secondary | ICD-10-CM | POA: Diagnosis not present

## 2020-05-17 DIAGNOSIS — I10 Essential (primary) hypertension: Secondary | ICD-10-CM | POA: Diagnosis not present

## 2020-05-18 NOTE — Pre-Procedure Instructions (Addendum)
Courtney Schmidt  05/18/2020      [place] [place] [address] [place] [address] Phone: [phone] Fax: [place] [address], [place] [address] Phone: [phone] Fax: [phone]    Your procedure is scheduled on Sept. 23  Report to [facility] Entrance A at 8:00A.M.  Call this number if you have problems the morning of surgery:  [phone]   Remember:  Do not eat or drink after midnight.      Take these medicines the morning of surgery with A SIP OF WATER :              Tylenol if needed              Atorvastatin (lipitor)              Cyclobenzaprine (flexeril) if needed              flonase if needed              Gabapentin (neurontin)              Loratadine (claritin)              Tramadol if needed               Famotidine (pepcid) if needed             7 days prior to surgery STOP taking any Aspirin (unless otherwise instructed by your surgeon), Aleve, Naproxen, Ibuprofen, Motrin, Advil, Goody's, BC's, all herbal medications, fish oil, and all vitamins.    Do not wear jewelry, make-up or nail polish.  Do not wear lotions, powders, or perfumes, or deodorant.  Do not shave 48 hours prior to surgery.  Men may shave face and neck.  Do not bring valuables to the hospital.  [facility] is not responsible for any belongings or valuables.  Contacts, dentures or bridgework may not be worn into surgery.  Leave your suitcase in the car.  After surgery it may be brought to your room.  For patients admitted to the hospital, discharge time will be determined by your treatment team.  Patients discharged the day of surgery will not be allowed to drive home.    Special instructions:   [facility]- Preparing For Surgery  Before surgery, you can play an important role. Because skin is not sterile, your skin  needs to be as free of germs as possible. You can reduce the number of germs on your skin by washing with CHG (chlorahexidine gluconate) Soap before surgery.  CHG is an antiseptic cleaner which kills germs and bonds with the skin to continue killing germs even after washing.    Oral Hygiene is also important to reduce your risk of infection.  Remember - BRUSH YOUR TEETH THE MORNING OF SURGERY WITH YOUR REGULAR TOOTHPASTE  Please do not use if you have an allergy to CHG or antibacterial soaps. If your skin becomes reddened/irritated stop using the CHG.  Do not shave (including legs and underarms) for at least 48 hours prior to first CHG shower. It is OK to shave your face.  Please follow these instructions carefully.   1. Shower the NIGHT BEFORE SURGERY and the MORNING OF SURGERY with CHG.   2. If you chose to wash your hair, wash your hair first as  usual with your normal shampoo.  3. After you shampoo, rinse your hair and body thoroughly to remove the shampoo.  4. Use CHG as you would any other liquid soap. You can apply CHG directly to the skin and wash gently with a scrungie or a clean washcloth.   5. Apply the CHG Soap to your body ONLY FROM THE NECK DOWN.  Do not use on open wounds or open sores. Avoid contact with your eyes, ears, mouth and genitals (private parts). Wash Face and genitals (private parts)  with your normal soap.  6. Wash thoroughly, paying special attention to the area where your surgery will be performed.  7. Thoroughly rinse your body with warm water from the neck down.  8. DO NOT shower/wash with your normal soap after using and rinsing off the CHG Soap.  9. Pat yourself dry with a CLEAN TOWEL.  10. Wear CLEAN PAJAMAS to bed the night before surgery, wear comfortable clothes the morning of surgery  11. Place CLEAN SHEETS on your bed the night of your first shower and DO NOT SLEEP WITH PETS.    Day of Surgery:  Do not apply any deodorants/lotions.  Please  wear clean clothes to the hospital/surgery center.   Remember to brush your teeth WITH YOUR REGULAR TOOTHPASTE.     Please read over the following fact sheets that you were given.

## 2020-05-19 ENCOUNTER — Encounter (HOSPITAL_COMMUNITY)
Admission: RE | Admit: 2020-05-19 | Discharge: 2020-05-19 | Disposition: A | Discharge status: Home / Self Care | Payer: Medicare HMO | Source: Ambulatory Visit | Attending: Neurological Surgery | Admitting: Neurological Surgery

## 2020-05-19 ENCOUNTER — Other Ambulatory Visit: Payer: Self-pay

## 2020-05-19 ENCOUNTER — Encounter (HOSPITAL_COMMUNITY): Payer: Self-pay

## 2020-05-19 ENCOUNTER — Other Ambulatory Visit (HOSPITAL_COMMUNITY)
Admission: RE | Admit: 2020-05-19 | Discharge: 2020-05-19 | Disposition: A | Discharge status: Home / Self Care | Payer: Medicare HMO | Source: Ambulatory Visit | Attending: Neurological Surgery | Admitting: Neurological Surgery

## 2020-05-19 DIAGNOSIS — Z01812 Encounter for preprocedural laboratory examination: Secondary | ICD-10-CM | POA: Insufficient documentation

## 2020-05-19 DIAGNOSIS — Z20822 Contact with and (suspected) exposure to covid-19: Secondary | ICD-10-CM | POA: Insufficient documentation

## 2020-05-19 HISTORY — DX: Family history of other specified conditions: Z84.89

## 2020-05-19 LAB — SURGICAL PCR SCREEN
MRSA, PCR: NEGATIVE
Staphylococcus aureus: NEGATIVE

## 2020-05-19 LAB — CBC
HCT: 43.5 % (ref 36.0–46.0)
Hemoglobin: 14.2 g/dL (ref 12.0–15.0)
MCH: 29.3 pg (ref 26.0–34.0)
MCHC: 32.6 g/dL (ref 30.0–36.0)
MCV: 89.7 fL (ref 80.0–100.0)
Platelets: 306 10*3/uL (ref 150–400)
RBC: 4.85 MIL/uL (ref 3.87–5.11)
RDW: 12.2 % (ref 11.5–15.5)
WBC: 6 10*3/uL (ref 4.0–10.5)
nRBC: 0 % (ref 0.0–0.2)

## 2020-05-19 LAB — BASIC METABOLIC PANEL
Anion gap: 13 (ref 5–15)
BUN: 10 mg/dL (ref 8–23)
CO2: 24 mmol/L (ref 22–32)
Calcium: 9.8 mg/dL (ref 8.9–10.3)
Chloride: 102 mmol/L (ref 98–111)
Creatinine, Ser: 0.91 mg/dL (ref 0.44–1.00)
GFR calc Af Amer: >60 mL/min (ref >60)
GFR calc non Af Amer: >60 mL/min (ref >60)
Glucose, Bld: 99 mg/dL (ref 70–99)
Potassium: 3.7 mmol/L (ref 3.5–5.1)
Sodium: 139 mmol/L (ref 135–145)

## 2020-05-19 LAB — SARS CORONAVIRUS 2 (TAT 6-24 HRS): SARS Coronavirus 2: NEGATIVE

## 2020-05-19 NOTE — Anesthesia Preprocedure Evaluation (Addendum)
Anesthesia Evaluation  Patient identified by MRN, date of birth, ID band Patient awake    Reviewed: Allergy & Precautions, H&P , NPO status , Patient's Chart, lab work & pertinent test results  Airway Mallampati: II  TM Distance: >3 FB Neck ROM: Full    Dental no notable dental hx. (+) Teeth Intact, Dental Advisory Given   Pulmonary neg pulmonary ROS,    Pulmonary exam normal breath sounds clear to auscultation       Cardiovascular hypertension, Pt. on medications  Rhythm:Regular Rate:Normal     Neuro/Psych negative neurological ROS  negative psych ROS   GI/Hepatic negative GI ROS, Neg liver ROS,   Endo/Other  negative endocrine ROS  Renal/GU negative Renal ROS  negative genitourinary   Musculoskeletal  (+) Arthritis ,   Abdominal   Peds  Hematology negative hematology ROS (+)   Anesthesia Other Findings   Reproductive/Obstetrics negative OB ROS                            Anesthesia Physical Anesthesia Plan  ASA: II  Anesthesia Plan: General   Post-op Pain Management:    Induction: Intravenous  PONV Risk Score and Plan: 4 or greater and Ondansetron, Dexamethasone and Midazolam  Airway Management Planned: Oral ETT  Additional Equipment:   Intra-op Plan:   Post-operative Plan: Extubation in OR  Informed Consent: I have reviewed the patients History and Physical, chart, labs and discussed the procedure including the risks, benefits and alternatives for the proposed anesthesia with the patient or authorized representative who has indicated his/her understanding and acceptance.     Dental advisory given  Plan Discussed with: CRNA  Anesthesia Plan Comments: (PAT note by Karoline Caldwell, PA-C: Pt reported at PAT that she was recently evaluated at [facility] in [facility] for complain of pain in arms/chest/back. She says she underwent cardiac workup was benign. She  reports it was ultimately decided her pain was radicular in nature and related to cervical disc pathology. She denies any ongoing chest pain, denies any exertional symptoms.   Pt was cleared for surgery by her PCP Dr. Harlan Stains on 05/17/20 as low risk, copy on chart.  Dr. Orest Dikes note from 04/23/2020 also mentions that patient was seen at the ER in [facility] and had a negative cardiac evaluation.  History of C5-C7 anterior fusion.   Preop labs reviewed, unremarkable.   EKG from recent ED evaluation at [facility] was requested, if not received will need tracing done on day of surgery. )       Anesthesia Quick Evaluation

## 2020-05-19 NOTE — Progress Notes (Signed)
Anesthesia Chart Review:  Pt reported at PAT that she was recently evaluated at [facility] in [facility] for complain of pain in arms/chest/back. She says she underwent cardiac workup was benign. She reports it was ultimately decided her pain was radicular in nature and related to cervical disc pathology. She denies any ongoing chest pain, denies any exertional symptoms.   Pt was cleared for surgery by her PCP Dr. Harlan Stains on 05/17/20 as low risk, copy on chart.  Dr. Orest Dikes note from 04/23/2020 also mentions that patient was seen at the ER in [facility] and had a negative cardiac evaluation.  History of C5-C7 anterior fusion.   Preop labs reviewed, unremarkable.   EKG from recent ED evaluation at [facility] was requested, if not received will need tracing done on day of surgery.   Wynonia Musty [facility] Short Stay Center/Anesthesiology Phone [phone] 05/19/2020 3:29 PM

## 2020-05-19 NOTE — Progress Notes (Addendum)
PCP - cynthia white Cardiologist - na  Chest x-ray - na EKG - requested Stress Test -  03/2020 ECHO - na Cardiac Cath - na  Sleep Study - na    Fasting Blood Sugar - na   Blood Thinner Instructions:  na Aspirin Instructions:na     COVID TEST- 05/18/20  Pt. Reported aug. 2021 she was having pain, numbness in her arms and pain in chest that radiated to her back. Pt. Hasn't had any more pain in the chest.Pt. Went to [facility] in [place]. [place]. Stated they ran a lot of cardiac test and the test were normal. Will request these test. Notified Jeneen Rinks burns, PA.   Anesthesia review:   Patient denies shortness of breath, fever, cough and chest pain at PAT appointment   All instructions explained to the patient, with a verbal understanding of the material. Patient agrees to go over the instructions while at home for a better understanding. Patient also instructed to self quarantine after being tested for COVID-19. The opportunity to ask questions was provided.

## 2020-05-20 ENCOUNTER — Ambulatory Visit (HOSPITAL_COMMUNITY): Payer: Medicare HMO | Admitting: Certified Registered Nurse Anesthetist

## 2020-05-20 ENCOUNTER — Ambulatory Visit (HOSPITAL_COMMUNITY): Payer: Medicare HMO | Admitting: Physician Assistant

## 2020-05-20 ENCOUNTER — Encounter (HOSPITAL_COMMUNITY): Payer: Self-pay | Admitting: Neurological Surgery

## 2020-05-20 ENCOUNTER — Encounter (HOSPITAL_COMMUNITY)
Admission: RE | Disposition: A | Discharge status: Home / Self Care | Payer: Self-pay | Source: Home / Self Care | Attending: Neurological Surgery

## 2020-05-20 ENCOUNTER — Ambulatory Visit (HOSPITAL_COMMUNITY): Payer: Medicare HMO

## 2020-05-20 ENCOUNTER — Ambulatory Visit (HOSPITAL_COMMUNITY)
Admission: RE | Admit: 2020-05-20 | Discharge: 2020-05-20 | Disposition: A | Discharge status: Home / Self Care | Payer: Medicare HMO | Attending: Neurological Surgery | Admitting: Neurological Surgery

## 2020-05-20 DIAGNOSIS — Z881 Allergy status to other antibiotic agents status: Secondary | ICD-10-CM | POA: Diagnosis not present

## 2020-05-20 DIAGNOSIS — Z885 Allergy status to narcotic agent status: Secondary | ICD-10-CM | POA: Insufficient documentation

## 2020-05-20 DIAGNOSIS — Z981 Arthrodesis status: Secondary | ICD-10-CM | POA: Diagnosis not present

## 2020-05-20 DIAGNOSIS — M5013 Cervical disc disorder with radiculopathy, cervicothoracic region: Secondary | ICD-10-CM | POA: Diagnosis not present

## 2020-05-20 DIAGNOSIS — Z419 Encounter for procedure for purposes other than remedying health state, unspecified: Secondary | ICD-10-CM

## 2020-05-20 DIAGNOSIS — M1990 Unspecified osteoarthritis, unspecified site: Secondary | ICD-10-CM | POA: Diagnosis not present

## 2020-05-20 DIAGNOSIS — M4802 Spinal stenosis, cervical region: Secondary | ICD-10-CM | POA: Diagnosis not present

## 2020-05-20 DIAGNOSIS — M50123 Cervical disc disorder at C6-C7 level with radiculopathy: Secondary | ICD-10-CM | POA: Diagnosis not present

## 2020-05-20 DIAGNOSIS — M5412 Radiculopathy, cervical region: Secondary | ICD-10-CM | POA: Diagnosis not present

## 2020-05-20 DIAGNOSIS — Z79899 Other long term (current) drug therapy: Secondary | ICD-10-CM | POA: Insufficient documentation

## 2020-05-20 DIAGNOSIS — I10 Essential (primary) hypertension: Secondary | ICD-10-CM | POA: Diagnosis not present

## 2020-05-20 DIAGNOSIS — Z882 Allergy status to sulfonamides status: Secondary | ICD-10-CM | POA: Insufficient documentation

## 2020-05-20 DIAGNOSIS — W19XXXA Unspecified fall, initial encounter: Secondary | ICD-10-CM | POA: Diagnosis not present

## 2020-05-20 DIAGNOSIS — R531 Weakness: Secondary | ICD-10-CM | POA: Diagnosis not present

## 2020-05-20 DIAGNOSIS — M5124 Other intervertebral disc displacement, thoracic region: Secondary | ICD-10-CM | POA: Diagnosis not present

## 2020-05-20 HISTORY — PX: POSTERIOR CERVICAL FUSION/FORAMINOTOMY: SHX5038

## 2020-05-20 SURGERY — POSTERIOR CERVICAL FUSION/FORAMINOTOMY LEVEL 1
Anesthesia: General | Laterality: Right

## 2020-05-20 MED ORDER — ACETAMINOPHEN 500 MG PO TABS: 1000.0000 mg | ORAL_TABLET | Freq: Once | ORAL | Status: DC

## 2020-05-20 MED ORDER — EPHEDRINE SULFATE-NACL 50-0.9 MG/10ML-% IV SOSY
PREFILLED_SYRINGE | INTRAVENOUS | Status: DC | PRN
  Administered 2020-05-20: 10 mg via INTRAVENOUS

## 2020-05-20 MED ORDER — ACETAMINOPHEN 10 MG/ML IV SOLN
INTRAVENOUS | Status: DC | PRN
  Administered 2020-05-20: 1000 mg via INTRAVENOUS

## 2020-05-20 MED ORDER — 0.9 % SODIUM CHLORIDE (POUR BTL) OPTIME
TOPICAL | Status: DC | PRN
  Administered 2020-05-20: 1000 mL

## 2020-05-20 MED ORDER — LIDOCAINE-EPINEPHRINE 1 %-1:100000 IJ SOLN
INTRAMUSCULAR | Status: DC | PRN
  Administered 2020-05-20: 5 mL
  Administered 2020-05-20: 10 mL

## 2020-05-20 MED ORDER — SUGAMMADEX SODIUM 200 MG/2ML IV SOLN
INTRAVENOUS | Status: DC | PRN
  Administered 2020-05-20: 200 mg via INTRAVENOUS

## 2020-05-20 MED ORDER — CEFAZOLIN SODIUM-DEXTROSE 2-4 GM/100ML-% IV SOLN
2.0000 g | INTRAVENOUS | Status: AC
  Administered 2020-05-20: 2 g via INTRAVENOUS
  Filled 2020-05-20: qty 100

## 2020-05-20 MED ORDER — ROCURONIUM BROMIDE 10 MG/ML (PF) SYRINGE
PREFILLED_SYRINGE | INTRAVENOUS | Status: AC
  Filled 2020-05-20: qty 10

## 2020-05-20 MED ORDER — THROMBIN 5000 UNITS EX SOLR
CUTANEOUS | Status: AC
  Filled 2020-05-20: qty 5000

## 2020-05-20 MED ORDER — CHLORHEXIDINE GLUCONATE CLOTH 2 % EX PADS: 6.0000 | MEDICATED_PAD | Freq: Once | CUTANEOUS | Status: DC

## 2020-05-20 MED ORDER — HYDROMORPHONE HCL 1 MG/ML IJ SOLN: 0.2500 mg | INTRAMUSCULAR | Status: DC | PRN

## 2020-05-20 MED ORDER — LACTATED RINGERS IV SOLN: INTRAVENOUS | Status: DC

## 2020-05-20 MED ORDER — MIDAZOLAM HCL 5 MG/5ML IJ SOLN
INTRAMUSCULAR | Status: DC | PRN
  Administered 2020-05-20: 2 mg via INTRAVENOUS

## 2020-05-20 MED ORDER — METHYLPREDNISOLONE ACETATE 80 MG/ML IJ SUSP
INTRAMUSCULAR | Status: DC | PRN
  Administered 2020-05-20: 40 mg

## 2020-05-20 MED ORDER — HYDROCODONE-ACETAMINOPHEN 5-325 MG PO TABS
1.0000 | ORAL_TABLET | Freq: Four times a day (QID) | ORAL | 0 refills | Status: DC | PRN
Start: 2020-05-20 — End: 2020-12-16

## 2020-05-20 MED ORDER — ACETAMINOPHEN 10 MG/ML IV SOLN
INTRAVENOUS | Status: AC
  Filled 2020-05-20: qty 100

## 2020-05-20 MED ORDER — ORAL CARE MOUTH RINSE: 15.0000 mL | Freq: Once | OROMUCOSAL | Status: AC

## 2020-05-20 MED ORDER — LIDOCAINE 2% (20 MG/ML) 5 ML SYRINGE
INTRAMUSCULAR | Status: DC | PRN
  Administered 2020-05-20: 60 mg via INTRAVENOUS

## 2020-05-20 MED ORDER — METHYLPREDNISOLONE ACETATE 80 MG/ML IJ SUSP
INTRAMUSCULAR | Status: AC
  Filled 2020-05-20: qty 1

## 2020-05-20 MED ORDER — BACITRACIN ZINC 500 UNIT/GM EX OINT
TOPICAL_OINTMENT | CUTANEOUS | Status: AC
  Filled 2020-05-20: qty 28.35

## 2020-05-20 MED ORDER — PHENYLEPHRINE HCL-NACL 10-0.9 MG/250ML-% IV SOLN
INTRAVENOUS | Status: DC | PRN
  Administered 2020-05-20: 25 ug/min via INTRAVENOUS

## 2020-05-20 MED ORDER — THROMBIN 20000 UNITS EX SOLR
CUTANEOUS | Status: DC | PRN
  Administered 2020-05-20: 20 mL via TOPICAL

## 2020-05-20 MED ORDER — BACITRACIN ZINC 500 UNIT/GM EX OINT
TOPICAL_OINTMENT | CUTANEOUS | Status: DC | PRN
  Administered 2020-05-20: 1 via TOPICAL

## 2020-05-20 MED ORDER — LIDOCAINE 2% (20 MG/ML) 5 ML SYRINGE
INTRAMUSCULAR | Status: AC
  Filled 2020-05-20: qty 5

## 2020-05-20 MED ORDER — PHENYLEPHRINE 40 MCG/ML (10ML) SYRINGE FOR IV PUSH (FOR BLOOD PRESSURE SUPPORT)
PREFILLED_SYRINGE | INTRAVENOUS | Status: AC
  Filled 2020-05-20: qty 10

## 2020-05-20 MED ORDER — LIDOCAINE-EPINEPHRINE 1 %-1:100000 IJ SOLN
INTRAMUSCULAR | Status: AC
  Filled 2020-05-20: qty 1

## 2020-05-20 MED ORDER — FENTANYL CITRATE (PF) 250 MCG/5ML IJ SOLN
INTRAMUSCULAR | Status: DC | PRN
Start: 2020-05-20 — End: 2020-05-20
  Administered 2020-05-20: 50 ug via INTRAVENOUS
  Administered 2020-05-20: 100 ug via INTRAVENOUS
  Administered 2020-05-20 (×2): 50 ug via INTRAVENOUS

## 2020-05-20 MED ORDER — HYDROCODONE-ACETAMINOPHEN 5-325 MG PO TABS
1.0000 | ORAL_TABLET | Freq: Four times a day (QID) | ORAL | Status: DC | PRN
Start: 2020-05-20 — End: 2020-05-20

## 2020-05-20 MED ORDER — BUPIVACAINE-EPINEPHRINE 0.5% -1:200000 IJ SOLN
INTRAMUSCULAR | Status: AC
  Filled 2020-05-20: qty 1

## 2020-05-20 MED ORDER — DEXAMETHASONE SODIUM PHOSPHATE 10 MG/ML IJ SOLN
INTRAMUSCULAR | Status: DC | PRN
  Administered 2020-05-20: 4 mg via INTRAVENOUS

## 2020-05-20 MED ORDER — EPHEDRINE 5 MG/ML INJ
INTRAVENOUS | Status: AC
  Filled 2020-05-20: qty 10

## 2020-05-20 MED ORDER — LACTATED RINGERS IV SOLN: INTRAVENOUS | Status: DC | PRN

## 2020-05-20 MED ORDER — ONDANSETRON HCL 4 MG/2ML IJ SOLN: 4.0000 mg | Freq: Four times a day (QID) | INTRAMUSCULAR | Status: DC | PRN

## 2020-05-20 MED ORDER — ONDANSETRON HCL 4 MG/2ML IJ SOLN
INTRAMUSCULAR | Status: DC | PRN
  Administered 2020-05-20: 4 mg via INTRAVENOUS

## 2020-05-20 MED ORDER — ONDANSETRON HCL 4 MG/2ML IJ SOLN
INTRAMUSCULAR | Status: AC
  Filled 2020-05-20: qty 2

## 2020-05-20 MED ORDER — PHENYLEPHRINE 40 MCG/ML (10ML) SYRINGE FOR IV PUSH (FOR BLOOD PRESSURE SUPPORT)
PREFILLED_SYRINGE | INTRAVENOUS | Status: DC | PRN
  Administered 2020-05-20 (×3): 80 ug via INTRAVENOUS

## 2020-05-20 MED ORDER — ROCURONIUM BROMIDE 10 MG/ML (PF) SYRINGE
PREFILLED_SYRINGE | INTRAVENOUS | Status: DC | PRN
  Administered 2020-05-20: 50 mg via INTRAVENOUS
  Administered 2020-05-20 (×2): 20 mg via INTRAVENOUS

## 2020-05-20 MED ORDER — PROPOFOL 10 MG/ML IV BOLUS
INTRAVENOUS | Status: AC
  Filled 2020-05-20: qty 20

## 2020-05-20 MED ORDER — THROMBIN 20000 UNITS EX SOLR
CUTANEOUS | Status: AC
  Filled 2020-05-20: qty 20000

## 2020-05-20 MED ORDER — PROPOFOL 10 MG/ML IV BOLUS
INTRAVENOUS | Status: DC | PRN
  Administered 2020-05-20: 100 mg via INTRAVENOUS
  Administered 2020-05-20: 50 mg via INTRAVENOUS

## 2020-05-20 MED ORDER — THROMBIN 5000 UNITS EX SOLR
OROMUCOSAL | Status: DC | PRN
  Administered 2020-05-20: 5 mL via TOPICAL

## 2020-05-20 MED ORDER — CHLORHEXIDINE GLUCONATE 0.12 % MT SOLN
15.0000 mL | Freq: Once | OROMUCOSAL | Status: AC
  Administered 2020-05-20: 15 mL via OROMUCOSAL
  Filled 2020-05-20: qty 15

## 2020-05-20 MED ORDER — VANCOMYCIN HCL 1000 MG IV SOLR
INTRAVENOUS | Status: AC
  Filled 2020-05-20: qty 1000

## 2020-05-20 MED ORDER — FENTANYL CITRATE (PF) 250 MCG/5ML IJ SOLN
INTRAMUSCULAR | Status: AC
  Filled 2020-05-20: qty 5

## 2020-05-20 MED ORDER — MIDAZOLAM HCL 2 MG/2ML IJ SOLN
INTRAMUSCULAR | Status: AC
  Filled 2020-05-20: qty 2

## 2020-05-20 MED ORDER — ONDANSETRON 4 MG PO TBDP: 4.0000 mg | ORAL_TABLET | Freq: Three times a day (TID) | ORAL | 0 refills | Status: DC | PRN

## 2020-05-20 MED ORDER — BUPIVACAINE-EPINEPHRINE (PF) 0.5% -1:200000 IJ SOLN
INTRAMUSCULAR | Status: DC | PRN
  Administered 2020-05-20: 10 mL via PERINEURAL
  Administered 2020-05-20: 5 mL via PERINEURAL

## 2020-05-20 SURGICAL SUPPLY — 69 items
ADH SKN CLS APL DERMABOND .7 (GAUZE/BANDAGES/DRESSINGS) ×1
BAG BANDED W/RUBBER/TAPE 36X54 (MISCELLANEOUS) ×2 IMPLANT
BAG EQP BAND 135X91 W/RBR TAPE (MISCELLANEOUS)
BAND INSRT 18 STRL LF DISP RB (MISCELLANEOUS) ×2
BAND RUBBER #18 3X1/16 STRL (MISCELLANEOUS) ×4 IMPLANT
BIT DRILL NEURO 2X3.1 SFT TUCH (MISCELLANEOUS) ×1 IMPLANT
BNDG GAUZE ELAST 4 BULKY (GAUZE/BANDAGES/DRESSINGS) ×2 IMPLANT
BUR SABER NEURO 2.5 (BURR) IMPLANT
CARTRIDGE OIL MAESTRO DRILL (MISCELLANEOUS) ×1 IMPLANT
CNTNR URN SCR LID CUP LEK RST (MISCELLANEOUS) ×1 IMPLANT
CONT SPEC 4OZ STRL OR WHT (MISCELLANEOUS) ×2
COVER BACK TABLE 60X90IN (DRAPES) ×1 IMPLANT
COVER MAYO STAND STRL (DRAPES) ×2 IMPLANT
COVER WAND RF STERILE (DRAPES) ×2 IMPLANT
DECANTER SPIKE VIAL GLASS SM (MISCELLANEOUS) ×2 IMPLANT
DERMABOND ADVANCED (GAUZE/BANDAGES/DRESSINGS) ×1
DERMABOND ADVANCED .7 DNX12 (GAUZE/BANDAGES/DRESSINGS) IMPLANT
DIFFUSER DRILL AIR PNEUMATIC (MISCELLANEOUS) ×2 IMPLANT
DRAPE C-ARM 42X72 X-RAY (DRAPES) ×1 IMPLANT
DRAPE C-ARMOR (DRAPES) IMPLANT
DRAPE LAPAROTOMY 100X72 PEDS (DRAPES) ×2 IMPLANT
DRAPE MICROSCOPE LEICA (MISCELLANEOUS) ×2 IMPLANT
DRAPE STERI IOBAN 125X83 (DRAPES) IMPLANT
DRAPE SURG 17X23 STRL (DRAPES) ×2 IMPLANT
DRILL NEURO 2X3.1 SOFT TOUCH (MISCELLANEOUS) ×2
DRSG OPSITE 4X5.5 SM (GAUZE/BANDAGES/DRESSINGS) ×1 IMPLANT
DURAPREP 26ML APPLICATOR (WOUND CARE) ×2 IMPLANT
ELECT BLADE INSULATED 6.5IN (ELECTROSURGICAL) ×2
ELECT REM PT RETURN 9FT ADLT (ELECTROSURGICAL) ×2
ELECTRODE BLDE INSULATED 6.5IN (ELECTROSURGICAL) ×1 IMPLANT
ELECTRODE REM PT RTRN 9FT ADLT (ELECTROSURGICAL) ×1 IMPLANT
GAUZE 4X4 16PLY RFD (DISPOSABLE) IMPLANT
GAUZE SPONGE 4X4 12PLY STRL (GAUZE/BANDAGES/DRESSINGS) ×2 IMPLANT
GLOVE BIOGEL PI IND STRL 7.5 (GLOVE) IMPLANT
GLOVE BIOGEL PI IND STRL 8 (GLOVE) ×2 IMPLANT
GLOVE BIOGEL PI INDICATOR 7.5 (GLOVE) ×2
GLOVE BIOGEL PI INDICATOR 8 (GLOVE) ×2
GLOVE ECLIPSE 7.0 STRL STRAW (GLOVE) ×1 IMPLANT
GLOVE ECLIPSE 8.0 STRL XLNG CF (GLOVE) ×4 IMPLANT
GLOVE SURG SS PI 7.0 STRL IVOR (GLOVE) ×3 IMPLANT
GOWN STRL REUS W/ TWL LRG LVL3 (GOWN DISPOSABLE) IMPLANT
GOWN STRL REUS W/ TWL XL LVL3 (GOWN DISPOSABLE) ×1 IMPLANT
GOWN STRL REUS W/TWL 2XL LVL3 (GOWN DISPOSABLE) IMPLANT
GOWN STRL REUS W/TWL LRG LVL3 (GOWN DISPOSABLE)
GOWN STRL REUS W/TWL XL LVL3 (GOWN DISPOSABLE) ×4
KIT BASIN OR (CUSTOM PROCEDURE TRAY) ×2 IMPLANT
KIT TURNOVER KIT B (KITS) ×2 IMPLANT
MARKER SKIN DUAL TIP RULER LAB (MISCELLANEOUS) ×2 IMPLANT
NDL HYPO 25X1 1.5 SAFETY (NEEDLE) ×1 IMPLANT
NDL SPNL 22GX3.5 QUINCKE BK (NEEDLE) IMPLANT
NEEDLE HYPO 25X1 1.5 SAFETY (NEEDLE) ×2 IMPLANT
NEEDLE SPNL 22GX3.5 QUINCKE BK (NEEDLE) ×4 IMPLANT
NS IRRIG 1000ML POUR BTL (IV SOLUTION) ×2 IMPLANT
OIL CARTRIDGE MAESTRO DRILL (MISCELLANEOUS) ×2
PACK LAMINECTOMY NEURO (CUSTOM PROCEDURE TRAY) ×2 IMPLANT
PAD ARMBOARD 7.5X6 YLW CONV (MISCELLANEOUS) ×3 IMPLANT
PATTIES SURGICAL .5 X.5 (GAUZE/BANDAGES/DRESSINGS) IMPLANT
PATTIES SURGICAL .5 X1 (DISPOSABLE) IMPLANT
PATTIES SURGICAL 1X1 (DISPOSABLE) IMPLANT
SPONGE LAP 4X18 RFD (DISPOSABLE) IMPLANT
STAPLER VISISTAT 35W (STAPLE) ×1 IMPLANT
SUT VIC AB 0 CT1 27 (SUTURE)
SUT VIC AB 0 CT1 27XBRD ANBCTR (SUTURE) ×1 IMPLANT
SUT VIC AB 2-0 CP2 18 (SUTURE) ×1 IMPLANT
SUT VIC AB 3-0 SH 8-18 (SUTURE) IMPLANT
TOWEL GREEN STERILE (TOWEL DISPOSABLE) IMPLANT
TOWEL GREEN STERILE FF (TOWEL DISPOSABLE) IMPLANT
TRAY FOLEY MTR SLVR 16FR STAT (SET/KITS/TRAYS/PACK) ×1 IMPLANT
WATER STERILE IRR 1000ML POUR (IV SOLUTION) ×2 IMPLANT

## 2020-05-20 NOTE — Anesthesia Procedure Notes (Signed)
Procedure Name: Intubation Date/Time: 05/20/2020 11:49 AM Performed by: Colin Benton, CRNA Pre-anesthesia Checklist: Patient identified, Emergency Drugs available, Suction available and Patient being monitored Patient Re-evaluated:Patient Re-evaluated prior to induction Oxygen Delivery Method: Circle system utilized Preoxygenation: Pre-oxygenation with 100% oxygen Induction Type: IV induction Ventilation: Mask ventilation without difficulty Laryngoscope Size: Miller and 2 Grade View: Grade I Tube size: 7.0 mm Number of attempts: 1 Airway Equipment and Method: Stylet Placement Confirmation: ETT inserted through vocal cords under direct vision,  positive ETCO2 and breath sounds checked- equal and bilateral Secured at: 21 cm Tube secured with: Tape Dental Injury: Teeth and Oropharynx as per pre-operative assessment

## 2020-05-20 NOTE — Anesthesia Postprocedure Evaluation (Signed)
Anesthesia Post Note  Patient: Courtney Schmidt  Procedure(s) Performed: FORAMINOTOMY RIGHT CERVICAL SEVEN- THORACIC ONE (Right )     Patient location during evaluation: PACU Anesthesia Type: General Level of consciousness: awake and alert Pain management: pain level controlled Vital Signs Assessment: post-procedure vital signs reviewed and stable Respiratory status: spontaneous breathing, nonlabored ventilation and respiratory function stable Cardiovascular status: blood pressure returned to baseline and stable Postop Assessment: no apparent nausea or vomiting Anesthetic complications: no   No complications documented.  Last Vitals:  Vitals:   05/20/20 1445 05/20/20 1450  BP: 126/65 121/67  Pulse: 78 79  Resp: 12 16  Temp:  36.6 C  SpO2: 97% 94%    Last Pain:  Vitals:   05/20/20 1450  TempSrc:   PainSc: 0-No pain                 [name],W. EDMOND

## 2020-05-20 NOTE — Transfer of Care (Signed)
Immediate Anesthesia Transfer of Care Note  Patient: Courtney Schmidt  Procedure(s) Performed: FORAMINOTOMY RIGHT CERVICAL SEVEN- THORACIC ONE (Right )  Patient Location: PACU  Anesthesia Type:General  Level of Consciousness: drowsy and patient cooperative  Airway & Oxygen Therapy: Patient Spontanous Breathing and Patient connected to nasal cannula oxygen  Post-op Assessment: Report given to RN, Post -op Vital signs reviewed and stable and Patient moving all extremities X 4  Post vital signs: Reviewed and stable  Last Vitals:  Vitals Value Taken Time  BP 131/56 05/20/20 1404  Temp 36.6 C 05/20/20 1404  Pulse 82 05/20/20 1410  Resp 16 05/20/20 1410  SpO2 99 % 05/20/20 1410  Vitals shown include unvalidated device data.  Last Pain:  Vitals:   05/20/20 1404  TempSrc:   PainSc: (P) 0-No pain      Patients Stated Pain Goal: 4 (44/46/19 0122)  Complications: No complications documented.

## 2020-05-20 NOTE — H&P (Signed)
Providing Compassionate, Quality Care - Together  NEUROSURGERY HISTORY & PHYSICAL   Courtney Schmidt is an 69 y.o. female.   Chief Complaint: Right upper extremity weakness, C8 radiculopathy HPI: This is a pleasant 69 year old female with a history of C5-7 ACDF in 1999 who recently had a fall approximately 2 months ago onto her outstretched right arm.  Since then she has had right arm pain, numbness, tingling radiating down into her last 2 digits.  MRI of the cervical spine revealed a C7-T1 right lateral recess and foraminal disc herniation, EMG confirmed C7/C8 radiculopathy on the right side.  She complains of grip weakness, tricep weakness and has had a difficult time using her right hand.  Past Medical History:  Diagnosis Date  . Allergy   . Arthritis   . Diverticulitis   . Family history of adverse reaction to anesthesia    sister,brother-, nephew -hard time waking up from surgery  . Hypertension     Past Surgical History:  Procedure Laterality Date  . CHOLECYSTECTOMY    . COLONOSCOPY  [id number]  . NECK SURGERY  [phone]   1999 fusion  . POLYPECTOMY  [id number]   h xTA polyps  . TUBAL LIGATION      Family History  Problem Relation Age of Onset  . Aneurysm Mother   . Pancreatic cancer Father   . Breast cancer Cousin   . Colon polyps Sister   . Colon cancer Neg Hx   . Rectal cancer Neg Hx   . Stomach cancer Neg Hx   . Esophageal cancer Neg Hx    Social History:  reports that she has never smoked. She has never used smokeless tobacco. She reports that she does not drink alcohol and does not use drugs.  Allergies:  Allergies  Allergen Reactions  . Codeine Nausea And Vomiting  . E-Mycin [Erythromycin] Nausea And Vomiting  . Sulfa Antibiotics Nausea And Vomiting    Medications Prior to Admission  Medication Sig Dispense Refill  . acetaminophen (TYLENOL) 500 MG tablet Take 500 mg by mouth every 6 (six) hours as needed for moderate pain.    Marland Kitchen atorvastatin  (LIPITOR) 20 MG tablet Take 20 mg by mouth daily.    . cyclobenzaprine (FLEXERIL) 10 MG tablet Take 10 mg by mouth daily as needed for pain.    . famotidine (PEPCID) 20 MG tablet Take 20 mg by mouth daily as needed for heartburn or indigestion.    . fluticasone (FLONASE) 50 MCG/ACT nasal spray Place 1 spray into both nostrils daily as needed for allergies.    Marland Kitchen gabapentin (NEURONTIN) 300 MG capsule Take 300 mg by mouth 3 (three) times daily.    Marland Kitchen loratadine (CLARITIN) 10 MG tablet Take 10 mg by mouth daily.    . traMADol (ULTRAM) 50 MG tablet Take 50 mg by mouth at bedtime as needed for moderate pain.    . valsartan-hydrochlorothiazide (DIOVAN-HCT) 160-12.5 MG tablet Take 1 tablet by mouth daily.      Results for orders placed or performed during the hospital encounter of 05/19/20 (from the past 48 hour(s))  SARS CORONAVIRUS 2 (TAT 6-24 HRS) Nasopharyngeal Nasopharyngeal Swab     Status: None   Collection Time: 05/19/20  2:34 PM   Specimen: Nasopharyngeal Swab  Result Value Ref Range   SARS Coronavirus 2 NEGATIVE NEGATIVE    Comment: (NOTE) SARS-CoV-2 target nucleic acids are NOT DETECTED.  The SARS-CoV-2 RNA is generally detectable in upper and lower respiratory specimens during the acute  phase of infection. Negative results do not preclude SARS-CoV-2 infection, do not rule out co-infections with other pathogens, and should not be used as the sole basis for treatment or other patient management decisions. Negative results must be combined with clinical observations, patient history, and epidemiological information. The expected result is Negative.  Fact Sheet for Patients: SugarRoll.be  Fact Sheet for Healthcare Providers: https://www.woods-mathews.com/  This test is not yet approved or cleared by the [place] FDA and  has been authorized for detection and/or diagnosis of SARS-CoV-2 by FDA under an Emergency Use Authorization (EUA).  This EUA will remain  in effect (meaning this test can be used) for the duration of the COVID-19 declaration under Se ction 564(b)(1) of the Act, 21 U.S.C. section 360bbb-3(b)(1), unless the authorization is terminated or revoked sooner.  Performed at [facility], [place] [address] [place], [place] [address]    No results found.  ROS 14 point review of systems was obtained which all pertinent positives and negatives are listed in HPI above Blood pressure (!) 150/71, pulse 69, temperature 98.1 F (36.7 C), temperature source Oral, resp. rate 18, SpO2 96 %. Physical Exam  AOx3 PERRLA EOMI Bilateral lower extremities 5/5 Left upper extremity 5/5 Right upper extremity 5/5 except tricep, grip 4/5 C8 sensory level decreased to light touch on the right  Assessment/Plan 69 year old female with  1.  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  Please do not hesitate to call with questions or concerns.   Elwin Sleight, [facility] Cell: [phone]

## 2020-05-20 NOTE — Op Note (Signed)
Providing Compassionate, Quality Care - Together  Date of service: 05/20/2020  PREOP DIAGNOSIS:  Right C8 radiculopathy Right tricep, interossei and grip weakness due to C7-T1 right lateral recess/foraminal disc herniation  POSTOP DIAGNOSIS: Same  PROCEDURE: Right C7-T1 posterior foraminotomy/hemilaminectomy/microdiscectomy Intraoperative use of microscope for microdissection and neural decompression Intraoperative use of fluoroscopy  SURGEON: Dr. Pieter Partridge C. Maite Burlison, DO  ASSISTANT: Dr. Consuella Lose, MD  ANESTHESIA: General Endotracheal  EBL: 30 cc  SPECIMENS: None  DRAINS: None  COMPLICATIONS: None  CONDITION: Stable  HISTORY: Courtney Schmidt is a 69 y.o. female that presented to the office with right upper extremity weakness and C8 radiculopathy after a ground-level fall.  MRI revealed a foraminal disc herniation at C7-T1 on the right with severe compression of the C8 nerve root.  EMG revealed C8 radiculopathy.  She had weakness in her tricep, grip and interossei.  We discussed surgical options including an anterior cervical discectomy and fusion as well as a posterior cervical foraminotomy, microdiscectomy as well as steroid injections.  Given her weakness and C8 radiculopathy I recommended a posterior cervical foraminotomy as she had no neck pain.  Risks and benefits of the surgery were discussed and agreed upon.  PROCEDURE IN DETAIL: The patient was brought to the operating room at [facility] After induction of general anesthesia, the patient was placed in a Sugita head holder and the patient was positioned on the operative table in the prone position. All pressure points were meticulously padded. Skin incision was then marked out and prepped and draped in the usual sterile fashion.  Lateral fluoroscopy was used to localize the C7-T1 interspinous space.  A midline incision was performed with a 10 blade.  Using Bovie electrocautery subperiosteal dissection was  performed on the right C7-T1 lamina exposing the C7-T1 joint.  Self-retaining retractors were placed in the wound.  Again using a spinal needle the C7-T1 joint was confirmed with lateral fluoroscopy.  The microscope was sterilely draped and brought into the field and used for the remainder of the procedure.  Using a high-speed drill the lateral and inferior lamina of C7 was removed as well as the lateral superior portion of T1 lamina. A medial facetectomy was performed with the high speed drill and the superior articulating process of T1 was followed down to drill the superior portion of the T1 pedicle.  The superior cortical border of the T1 pedicle was fractured down and removed with pituitary rongeurs.  The ligamentum flavum was resected with Kerrison rongeurs to expose the lateral edge of the thecal sac and the nerve root itself.  The C8 nerve root was visualized and noted to be tense.  Using micro dissectors and curettes the C7-T1 herniated disc was carefully dissected free from the ventral thecal sac/C8 nerve root.  This was done from an inferior approach to the C8 nerve root with gentle retraction superiorly.  Multiple small free fragments of disc were removed.  At this point the C8 nerve root appeared more supple and free-floating.  It was pulsatile.  Using a nerve root diessector, I could freely pass the instrument inferiorly, superiorly and laterally along the nerve root out into the foramen.  Again, with a Murphy ball feeler, I felt medially under the C8 nerve root and lateral thecal sac which felt decompressed.  Hemostasis was achieved with Surgi-Flo and cottonoids.  The wound was irrigated and noted to be excellently hemostatic.  A small piece of Gelfoam with Depo-Medrol was placed over the nerve root.  The self-retaining retractors were taken out of the wound.  Hemostasis was achieved with bipolar cautery.  Wound was then closed in layers with 2-0 Vicryl sutures.  The skin was secured with skin glue.   Sterile dressing was applied. She was carefully transitioned on to the hospital bed, and the head holder was removed without complication. At the end of the case all sponge, needle, and instrument counts were correct. The patient was then extubated, and taken to the post-anesthesia care unit in stable hemodynamic condition.

## 2020-05-20 NOTE — Progress Notes (Signed)
   Providing Compassionate, Quality Care - Together  NEUROSURGERY PROGRESS NOTE   S: Patient seen and examined in PACU.  No complaints at this time, has no right arm pain  O: EXAM:  BP (!) 131/56 (BP Location: Right Arm)   Pulse 78   Temp 97.8 F (36.6 C)   Resp (!) 9   SpO2 96%   Awake, alert, oriented  Speech fluent, appropriate  CNs grossly intact  5/5 BLE  LUE 5/5 RUE delt, bi 5/5, tri/grip 4+/5 C8 dermatome SILT  ASSESSMENT:  69 y.o. female with  1.  Right C8 radiculopathy due to C7-T1 foraminal disc herniation  -Status post right C7-T1 foraminotomy/microdiscectomy on 05/20/2020  PLAN: -DC home -Pain control -Light activity -Meds given -Follow-up in 2 weeks    Thank you for allowing me to participate in this patient's care.  Please do not hesitate to call with questions or concerns.   Elwin Sleight, [facility] Cell: [phone]

## 2020-05-20 NOTE — Progress Notes (Signed)
Lindsi took glasses to patient's husband Josph Macho

## 2020-05-21 ENCOUNTER — Encounter (HOSPITAL_COMMUNITY): Payer: Self-pay | Admitting: Neurological Surgery

## 2020-05-27 DIAGNOSIS — R946 Abnormal results of thyroid function studies: Secondary | ICD-10-CM | POA: Diagnosis not present

## 2020-05-27 DIAGNOSIS — Z23 Encounter for immunization: Secondary | ICD-10-CM | POA: Diagnosis not present

## 2020-05-31 DIAGNOSIS — Z6831 Body mass index (BMI) 31.0-31.9, adult: Secondary | ICD-10-CM | POA: Diagnosis not present

## 2020-05-31 DIAGNOSIS — M5020 Other cervical disc displacement, unspecified cervical region: Secondary | ICD-10-CM | POA: Diagnosis not present

## 2020-05-31 DIAGNOSIS — M5412 Radiculopathy, cervical region: Secondary | ICD-10-CM | POA: Diagnosis not present

## 2020-06-03 DIAGNOSIS — M5412 Radiculopathy, cervical region: Secondary | ICD-10-CM | POA: Diagnosis not present

## 2020-06-03 DIAGNOSIS — M5002 Cervical disc disorder with myelopathy, mid-cervical region, unspecified level: Secondary | ICD-10-CM | POA: Diagnosis not present

## 2020-06-07 DIAGNOSIS — M5002 Cervical disc disorder with myelopathy, mid-cervical region, unspecified level: Secondary | ICD-10-CM | POA: Diagnosis not present

## 2020-06-07 DIAGNOSIS — M5412 Radiculopathy, cervical region: Secondary | ICD-10-CM | POA: Diagnosis not present

## 2020-06-09 DIAGNOSIS — M5412 Radiculopathy, cervical region: Secondary | ICD-10-CM | POA: Diagnosis not present

## 2020-06-09 DIAGNOSIS — M5002 Cervical disc disorder with myelopathy, mid-cervical region, unspecified level: Secondary | ICD-10-CM | POA: Diagnosis not present

## 2020-06-14 DIAGNOSIS — M5002 Cervical disc disorder with myelopathy, mid-cervical region, unspecified level: Secondary | ICD-10-CM | POA: Diagnosis not present

## 2020-06-14 DIAGNOSIS — M5412 Radiculopathy, cervical region: Secondary | ICD-10-CM | POA: Diagnosis not present

## 2020-06-18 DIAGNOSIS — M5002 Cervical disc disorder with myelopathy, mid-cervical region, unspecified level: Secondary | ICD-10-CM | POA: Diagnosis not present

## 2020-06-18 DIAGNOSIS — M5412 Radiculopathy, cervical region: Secondary | ICD-10-CM | POA: Diagnosis not present

## 2020-06-22 DIAGNOSIS — M5412 Radiculopathy, cervical region: Secondary | ICD-10-CM | POA: Diagnosis not present

## 2020-06-22 DIAGNOSIS — M5002 Cervical disc disorder with myelopathy, mid-cervical region, unspecified level: Secondary | ICD-10-CM | POA: Diagnosis not present

## 2020-06-25 DIAGNOSIS — M5412 Radiculopathy, cervical region: Secondary | ICD-10-CM | POA: Diagnosis not present

## 2020-06-25 DIAGNOSIS — M5002 Cervical disc disorder with myelopathy, mid-cervical region, unspecified level: Secondary | ICD-10-CM | POA: Diagnosis not present

## 2020-06-29 DIAGNOSIS — M5412 Radiculopathy, cervical region: Secondary | ICD-10-CM | POA: Diagnosis not present

## 2020-06-29 DIAGNOSIS — M5002 Cervical disc disorder with myelopathy, mid-cervical region, unspecified level: Secondary | ICD-10-CM | POA: Diagnosis not present

## 2020-07-08 ENCOUNTER — Other Ambulatory Visit: Payer: Self-pay | Admitting: Endocrinology

## 2020-07-08 DIAGNOSIS — R7301 Impaired fasting glucose: Secondary | ICD-10-CM | POA: Diagnosis not present

## 2020-07-08 DIAGNOSIS — E0590 Thyrotoxicosis, unspecified without thyrotoxic crisis or storm: Secondary | ICD-10-CM | POA: Diagnosis not present

## 2020-07-08 DIAGNOSIS — I10 Essential (primary) hypertension: Secondary | ICD-10-CM | POA: Diagnosis not present

## 2020-07-09 ENCOUNTER — Other Ambulatory Visit: Payer: Self-pay | Admitting: Endocrinology

## 2020-07-09 DIAGNOSIS — E0590 Thyrotoxicosis, unspecified without thyrotoxic crisis or storm: Secondary | ICD-10-CM

## 2020-07-15 ENCOUNTER — Ambulatory Visit
Admission: RE | Admit: 2020-07-15 | Discharge: 2020-07-15 | Disposition: A | Discharge status: Home / Self Care | Payer: Medicare HMO | Source: Ambulatory Visit | Attending: Endocrinology | Admitting: Endocrinology

## 2020-07-15 DIAGNOSIS — E0590 Thyrotoxicosis, unspecified without thyrotoxic crisis or storm: Secondary | ICD-10-CM

## 2020-07-20 ENCOUNTER — Encounter (HOSPITAL_COMMUNITY): Payer: Self-pay

## 2020-07-20 ENCOUNTER — Other Ambulatory Visit: Payer: Self-pay

## 2020-07-20 ENCOUNTER — Encounter (HOSPITAL_COMMUNITY)
Admission: RE | Admit: 2020-07-20 | Discharge: 2020-07-20 | Disposition: A | Discharge status: Home / Self Care | Payer: Medicare HMO | Source: Ambulatory Visit | Attending: Endocrinology | Admitting: Endocrinology

## 2020-07-20 ENCOUNTER — Encounter (HOSPITAL_COMMUNITY): Admission: RE | Admit: 2020-07-20 | Payer: Medicare HMO | Source: Ambulatory Visit

## 2020-07-20 DIAGNOSIS — E0590 Thyrotoxicosis, unspecified without thyrotoxic crisis or storm: Secondary | ICD-10-CM

## 2020-07-21 ENCOUNTER — Encounter (HOSPITAL_COMMUNITY): Admission: RE | Admit: 2020-07-21 | Payer: Medicare HMO | Source: Ambulatory Visit

## 2020-08-11 ENCOUNTER — Encounter (HOSPITAL_COMMUNITY)
Admission: RE | Admit: 2020-08-11 | Discharge: 2020-08-11 | Disposition: A | Discharge status: Home / Self Care | Payer: Medicare HMO | Source: Ambulatory Visit | Attending: Endocrinology | Admitting: Endocrinology

## 2020-08-11 ENCOUNTER — Other Ambulatory Visit: Payer: Self-pay

## 2020-08-11 DIAGNOSIS — E0590 Thyrotoxicosis, unspecified without thyrotoxic crisis or storm: Secondary | ICD-10-CM | POA: Insufficient documentation

## 2020-08-11 MED ORDER — SODIUM IODIDE I-123 7.4 MBQ CAPS
462.0000 | ORAL_CAPSULE | Freq: Once | ORAL | Status: AC
  Administered 2020-08-11: 462 via ORAL

## 2020-08-12 ENCOUNTER — Encounter (HOSPITAL_COMMUNITY)
Admission: RE | Admit: 2020-08-12 | Discharge: 2020-08-12 | Disposition: A | Discharge status: Home / Self Care | Payer: Medicare HMO | Source: Ambulatory Visit | Attending: Endocrinology | Admitting: Endocrinology

## 2020-08-12 DIAGNOSIS — E0590 Thyrotoxicosis, unspecified without thyrotoxic crisis or storm: Secondary | ICD-10-CM | POA: Diagnosis not present

## 2020-08-17 DIAGNOSIS — E0590 Thyrotoxicosis, unspecified without thyrotoxic crisis or storm: Secondary | ICD-10-CM | POA: Diagnosis not present

## 2020-09-28 DIAGNOSIS — I10 Essential (primary) hypertension: Secondary | ICD-10-CM | POA: Diagnosis not present

## 2020-09-28 DIAGNOSIS — R7303 Prediabetes: Secondary | ICD-10-CM | POA: Diagnosis not present

## 2020-09-28 DIAGNOSIS — E785 Hyperlipidemia, unspecified: Secondary | ICD-10-CM | POA: Diagnosis not present

## 2020-09-28 DIAGNOSIS — R300 Dysuria: Secondary | ICD-10-CM | POA: Diagnosis not present

## 2020-09-28 DIAGNOSIS — N3090 Cystitis, unspecified without hematuria: Secondary | ICD-10-CM | POA: Diagnosis not present

## 2020-10-11 DIAGNOSIS — I10 Essential (primary) hypertension: Secondary | ICD-10-CM | POA: Diagnosis not present

## 2020-10-11 DIAGNOSIS — Z6832 Body mass index (BMI) 32.0-32.9, adult: Secondary | ICD-10-CM | POA: Diagnosis not present

## 2020-11-04 ENCOUNTER — Encounter: Payer: Self-pay | Admitting: Internal Medicine

## 2020-12-16 ENCOUNTER — Other Ambulatory Visit: Payer: Self-pay

## 2020-12-16 ENCOUNTER — Ambulatory Visit (AMBULATORY_SURGERY_CENTER): Payer: Self-pay | Admitting: *Deleted

## 2020-12-16 VITALS — Ht 62.0 in | Wt 179.0 lb

## 2020-12-16 DIAGNOSIS — Z86010 Personal history of colonic polyps: Secondary | ICD-10-CM

## 2020-12-16 MED ORDER — SUPREP BOWEL PREP KIT 17.5-3.13-1.6 GM/177ML PO SOLN: 1.0000 | Freq: Once | ORAL | 0 refills | Status: AC

## 2020-12-16 NOTE — Progress Notes (Signed)

## 2021-01-03 ENCOUNTER — Encounter: Payer: Medicare HMO | Admitting: Internal Medicine

## 2021-01-05 ENCOUNTER — Ambulatory Visit (AMBULATORY_SURGERY_CENTER): Payer: Medicare HMO | Admitting: Internal Medicine

## 2021-01-05 ENCOUNTER — Encounter: Payer: Self-pay | Admitting: Internal Medicine

## 2021-01-05 ENCOUNTER — Other Ambulatory Visit: Payer: Self-pay

## 2021-01-05 VITALS — BP 103/65 | HR 64 | Temp 97.8°F | Resp 12 | Ht 62.0 in | Wt 179.0 lb

## 2021-01-05 DIAGNOSIS — D122 Benign neoplasm of ascending colon: Secondary | ICD-10-CM

## 2021-01-05 DIAGNOSIS — Z860100 Personal history of colon polyps, unspecified: Secondary | ICD-10-CM

## 2021-01-05 DIAGNOSIS — D124 Benign neoplasm of descending colon: Secondary | ICD-10-CM | POA: Diagnosis not present

## 2021-01-05 DIAGNOSIS — Z8371 Family history of colonic polyps: Secondary | ICD-10-CM | POA: Diagnosis not present

## 2021-01-05 MED ORDER — SODIUM CHLORIDE 0.9 % IV SOLN: 500.0000 mL | Freq: Once | INTRAVENOUS | Status: DC

## 2021-01-05 NOTE — Progress Notes (Signed)
pt tolerated well. VSS. awake and to recovery. Report given to RN.  

## 2021-01-05 NOTE — Op Note (Signed)
[facility] Patient Name: Courtney Schmidt Procedure Date: 01/05/2021 11:23 AM MRN: [id number] Endoscopist: Jerene Bears , MD Age: 70 Referring MD:  Date of Birth: June 05, 1951 Gender: Female Account #: [id number] Procedure:                Colonoscopy Indications:              High risk colon cancer surveillance: Personal                            history of non-advanced adenoma (SSP and adenoma in                            2004, adenoma 2012), Family history of advanced                            adenomas of the colon in multiple first-degree                            relatives (brother and sister), Last colonoscopy:                            November 2016 Medicines:                Monitored Anesthesia Care Procedure:                Pre-Anesthesia Assessment:                           - Prior to the procedure, a History and Physical                            was performed, and patient medications and                            allergies were reviewed. The patient's tolerance of                            previous anesthesia was also reviewed. The risks                            and benefits of the procedure and the sedation                            options and risks were discussed with the patient.                            All questions were answered, and informed consent                            was obtained. Prior Anticoagulants: The patient has                            taken no previous anticoagulant or antiplatelet  agents. ASA Grade Assessment: II - A patient with                            mild systemic disease. After reviewing the risks                            and benefits, the patient was deemed in                            satisfactory condition to undergo the procedure.                           After obtaining informed consent, the colonoscope                            was passed under direct vision. Throughout the                             procedure, the patient's blood pressure, pulse, and                            oxygen saturations were monitored continuously. The                            Olympus [id number]) Colonoscope was                            introduced through the anus and advanced to the                            cecum, identified by appendiceal orifice and                            ileocecal valve. The colonoscopy was performed                            without difficulty. The patient tolerated the                            procedure well. The quality of the bowel                            preparation was good. The ileocecal valve,                            appendiceal orifice, and rectum were photographed. Scope In: 11:35:44 AM Scope Out: 11:51:11 AM Scope Withdrawal Time: 0 hours 12 minutes 29 seconds  Total Procedure Duration: 0 hours 15 minutes 27 seconds  Findings:                 The digital rectal exam was normal.                           Two sessile polyps were found in the ascending  colon. The polyps were 4 to 6 mm in size. These                            polyps were removed with a cold snare. Resection                            and retrieval were complete.                           Three sessile polyps were found in the descending                            colon. The polyps were 3 to 5 mm in size. These                            polyps were removed with a cold snare. Resection                            and retrieval were complete.                           Multiple small-mouthed diverticula were found in                            the sigmoid colon.                           The retroflexed view of the distal rectum and anal                            verge was normal and showed no anal or rectal                            abnormalities. Complications:            No immediate complications. Estimated Blood Loss:     Estimated blood  loss was minimal. Impression:               - Two 4 to 6 mm polyps in the ascending colon,                            removed with a cold snare. Resected and retrieved.                           - Three 3 to 5 mm polyps in the descending colon,                            removed with a cold snare. Resected and retrieved.                           - Diverticulosis in the sigmoid colon.                           - The distal rectum and  anal verge are normal on                            retroflexion view. Recommendation:           - Patient has a contact number available for                            emergencies. The signs and symptoms of potential                            delayed complications were discussed with the                            patient. Return to normal activities tomorrow.                            Written discharge instructions were provided to the                            patient.                           - Resume previous diet.                           - Continue present medications.                           - Await pathology results.                           - Repeat colonoscopy is recommended for                            surveillance. The colonoscopy date will be                            determined after pathology results from today's                            exam become available for review. Jerene Bears, MD 01/05/2021 11:55:32 AM This report has been signed electronically.

## 2021-01-05 NOTE — Progress Notes (Signed)
Pt's states no medical or surgical changes since previsit or office visit.  [facility]

## 2021-01-05 NOTE — Progress Notes (Signed)
Called to room to assist during endoscopic procedure.  Patient ID and intended procedure confirmed with present staff. Received instructions for my participation in the procedure from the performing physician.  

## 2021-01-05 NOTE — Patient Instructions (Signed)
Handout on polyps and diverticulosis given    YOU HAD AN ENDOSCOPIC PROCEDURE TODAY AT THE [facility]:   Refer to the procedure report that was given to you for any specific questions about what was found during the examination.  If the procedure report does not answer your questions, please call your gastroenterologist to clarify.  If you requested that your care partner not be given the details of your procedure findings, then the procedure report has been included in a sealed envelope for you to review at your convenience later.  YOU SHOULD EXPECT: Some feelings of bloating in the abdomen. Passage of more gas than usual.  Walking can help get rid of the air that was put into your GI tract during the procedure and reduce the bloating. If you had a lower endoscopy (such as a colonoscopy or flexible sigmoidoscopy) you may notice spotting of blood in your stool or on the toilet paper. If you underwent a bowel prep for your procedure, you may not have a normal bowel movement for a few days.  Please Note:  You might notice some irritation and congestion in your nose or some drainage.  This is from the oxygen used during your procedure.  There is no need for concern and it should clear up in a day or so.  SYMPTOMS TO REPORT IMMEDIATELY:   Following lower endoscopy (colonoscopy or flexible sigmoidoscopy):  Excessive amounts of blood in the stool  Significant tenderness or worsening of abdominal pains  Swelling of the abdomen that is new, acute  Fever of 100F or higher   For urgent or emergent issues, a gastroenterologist can be reached at any hour by calling [phone]. Do not use MyChart messaging for urgent concerns.    DIET:  We do recommend a small meal at first, but then you may proceed to your regular diet.  Drink plenty of fluids but you should avoid alcoholic beverages for 24 hours.  ACTIVITY:  You should plan to take it easy for the rest of today and you should NOT  DRIVE or use heavy machinery until tomorrow (because of the sedation medicines used during the test).    FOLLOW UP: Our staff will call the number listed on your records 48-72 hours following your procedure to check on you and address any questions or concerns that you may have regarding the information given to you following your procedure. If we do not reach you, we will leave a message.  We will attempt to reach you two times.  During this call, we will ask if you have developed any symptoms of COVID 19. If you develop any symptoms (ie: fever, flu-like symptoms, shortness of breath, cough etc.) before then, please call [phone].  If you test positive for Covid 19 in the 2 weeks post procedure, please call and report this information to us.    If any biopsies were taken you will be contacted by phone or by letter within the next 1-3 weeks.  Please call us at [phone] if you have not heard about the biopsies in 3 weeks.    SIGNATURES/CONFIDENTIALITY: You and/or your care partner have signed paperwork which will be entered into your electronic medical record.  These signatures attest to the fact that that the information above on your After Visit Summary has been reviewed and is understood.  Full responsibility of the confidentiality of this discharge information lies with you and/or your care-partner. 

## 2021-01-07 ENCOUNTER — Telehealth: Payer: Self-pay

## 2021-01-07 NOTE — Telephone Encounter (Signed)
LVM

## 2021-01-07 NOTE — Telephone Encounter (Signed)
NO ANSWER, MESSAGE LEFT FOR PATIENT. 

## 2021-01-18 ENCOUNTER — Encounter: Payer: Self-pay | Admitting: Internal Medicine

## 2021-02-22 DIAGNOSIS — L237 Allergic contact dermatitis due to plants, except food: Secondary | ICD-10-CM | POA: Diagnosis not present

## 2021-04-26 DIAGNOSIS — H5203 Hypermetropia, bilateral: Secondary | ICD-10-CM | POA: Diagnosis not present

## 2021-04-26 DIAGNOSIS — H25813 Combined forms of age-related cataract, bilateral: Secondary | ICD-10-CM | POA: Diagnosis not present

## 2021-04-28 DIAGNOSIS — J309 Allergic rhinitis, unspecified: Secondary | ICD-10-CM | POA: Diagnosis not present

## 2021-04-28 DIAGNOSIS — E785 Hyperlipidemia, unspecified: Secondary | ICD-10-CM | POA: Diagnosis not present

## 2021-04-28 DIAGNOSIS — R7303 Prediabetes: Secondary | ICD-10-CM | POA: Diagnosis not present

## 2021-04-28 DIAGNOSIS — E0590 Thyrotoxicosis, unspecified without thyrotoxic crisis or storm: Secondary | ICD-10-CM | POA: Diagnosis not present

## 2021-04-28 DIAGNOSIS — Z23 Encounter for immunization: Secondary | ICD-10-CM | POA: Diagnosis not present

## 2021-04-28 DIAGNOSIS — I700 Atherosclerosis of aorta: Secondary | ICD-10-CM | POA: Diagnosis not present

## 2021-04-28 DIAGNOSIS — I10 Essential (primary) hypertension: Secondary | ICD-10-CM | POA: Diagnosis not present

## 2021-04-28 DIAGNOSIS — Z0000 Encounter for general adult medical examination without abnormal findings: Secondary | ICD-10-CM | POA: Diagnosis not present

## 2021-05-09 DIAGNOSIS — M5020 Other cervical disc displacement, unspecified cervical region: Secondary | ICD-10-CM | POA: Diagnosis not present

## 2021-05-12 DIAGNOSIS — U071 COVID-19: Secondary | ICD-10-CM | POA: Diagnosis not present

## 2021-06-29 DIAGNOSIS — H8110 Benign paroxysmal vertigo, unspecified ear: Secondary | ICD-10-CM | POA: Diagnosis not present

## 2021-06-29 DIAGNOSIS — J0110 Acute frontal sinusitis, unspecified: Secondary | ICD-10-CM | POA: Diagnosis not present

## 2021-11-03 DIAGNOSIS — L290 Pruritus ani: Secondary | ICD-10-CM | POA: Diagnosis not present

## 2021-11-03 DIAGNOSIS — I700 Atherosclerosis of aorta: Secondary | ICD-10-CM | POA: Diagnosis not present

## 2021-11-03 DIAGNOSIS — E785 Hyperlipidemia, unspecified: Secondary | ICD-10-CM | POA: Diagnosis not present

## 2021-11-03 DIAGNOSIS — R7303 Prediabetes: Secondary | ICD-10-CM | POA: Diagnosis not present

## 2021-11-03 DIAGNOSIS — I10 Essential (primary) hypertension: Secondary | ICD-10-CM | POA: Diagnosis not present

## 2021-11-03 DIAGNOSIS — Z23 Encounter for immunization: Secondary | ICD-10-CM | POA: Diagnosis not present

## 2021-11-03 DIAGNOSIS — E0590 Thyrotoxicosis, unspecified without thyrotoxic crisis or storm: Secondary | ICD-10-CM | POA: Diagnosis not present

## 2022-03-14 ENCOUNTER — Other Ambulatory Visit: Payer: Self-pay | Admitting: Family Medicine

## 2022-03-14 DIAGNOSIS — Z1231 Encounter for screening mammogram for malignant neoplasm of breast: Secondary | ICD-10-CM

## 2022-04-05 ENCOUNTER — Ambulatory Visit: Payer: Medicare HMO

## 2022-04-06 ENCOUNTER — Ambulatory Visit
Admission: RE | Admit: 2022-04-06 | Discharge: 2022-04-06 | Disposition: A | Discharge status: Home / Self Care | Payer: Medicare HMO | Source: Ambulatory Visit | Attending: Family Medicine | Admitting: Family Medicine

## 2022-04-06 DIAGNOSIS — Z1231 Encounter for screening mammogram for malignant neoplasm of breast: Secondary | ICD-10-CM

## 2022-04-24 ENCOUNTER — Other Ambulatory Visit: Payer: Self-pay | Admitting: Obstetrics and Gynecology

## 2022-04-24 DIAGNOSIS — Z01419 Encounter for gynecological examination (general) (routine) without abnormal findings: Secondary | ICD-10-CM | POA: Diagnosis not present

## 2022-04-24 DIAGNOSIS — Z1231 Encounter for screening mammogram for malignant neoplasm of breast: Secondary | ICD-10-CM | POA: Diagnosis not present

## 2022-04-24 DIAGNOSIS — R300 Dysuria: Secondary | ICD-10-CM | POA: Diagnosis not present

## 2022-04-24 DIAGNOSIS — Z13820 Encounter for screening for osteoporosis: Secondary | ICD-10-CM

## 2022-04-24 DIAGNOSIS — Z6831 Body mass index (BMI) 31.0-31.9, adult: Secondary | ICD-10-CM | POA: Diagnosis not present

## 2022-04-24 DIAGNOSIS — Z124 Encounter for screening for malignant neoplasm of cervix: Secondary | ICD-10-CM | POA: Diagnosis not present

## 2022-04-24 DIAGNOSIS — Z1211 Encounter for screening for malignant neoplasm of colon: Secondary | ICD-10-CM | POA: Diagnosis not present

## 2022-05-11 DIAGNOSIS — H25813 Combined forms of age-related cataract, bilateral: Secondary | ICD-10-CM | POA: Diagnosis not present

## 2022-05-11 DIAGNOSIS — H524 Presbyopia: Secondary | ICD-10-CM | POA: Diagnosis not present

## 2022-05-11 DIAGNOSIS — H5203 Hypermetropia, bilateral: Secondary | ICD-10-CM | POA: Diagnosis not present

## 2022-05-29 DIAGNOSIS — M545 Low back pain, unspecified: Secondary | ICD-10-CM | POA: Diagnosis not present

## 2022-05-29 DIAGNOSIS — Z23 Encounter for immunization: Secondary | ICD-10-CM | POA: Diagnosis not present

## 2022-05-29 DIAGNOSIS — R7303 Prediabetes: Secondary | ICD-10-CM | POA: Diagnosis not present

## 2022-05-29 DIAGNOSIS — M79641 Pain in right hand: Secondary | ICD-10-CM | POA: Diagnosis not present

## 2022-05-29 DIAGNOSIS — I700 Atherosclerosis of aorta: Secondary | ICD-10-CM | POA: Diagnosis not present

## 2022-05-29 DIAGNOSIS — E785 Hyperlipidemia, unspecified: Secondary | ICD-10-CM | POA: Diagnosis not present

## 2022-05-29 DIAGNOSIS — I10 Essential (primary) hypertension: Secondary | ICD-10-CM | POA: Diagnosis not present

## 2022-06-18 IMAGING — RF DG C-ARM 1-60 MIN
1 series · 1 of 1 positions shown · non-contrast
Comparison: April 24, 2011.

CLINICAL DATA: Right C7-T1 foraminotomy.

EXAM:
OPERATIVE THORACIC SPINE 1 VIEW(S)
Radiation exposure index: 4.6 mGy.

[Series 1: run · 1 of 1 slices shown]
[im 1/1]
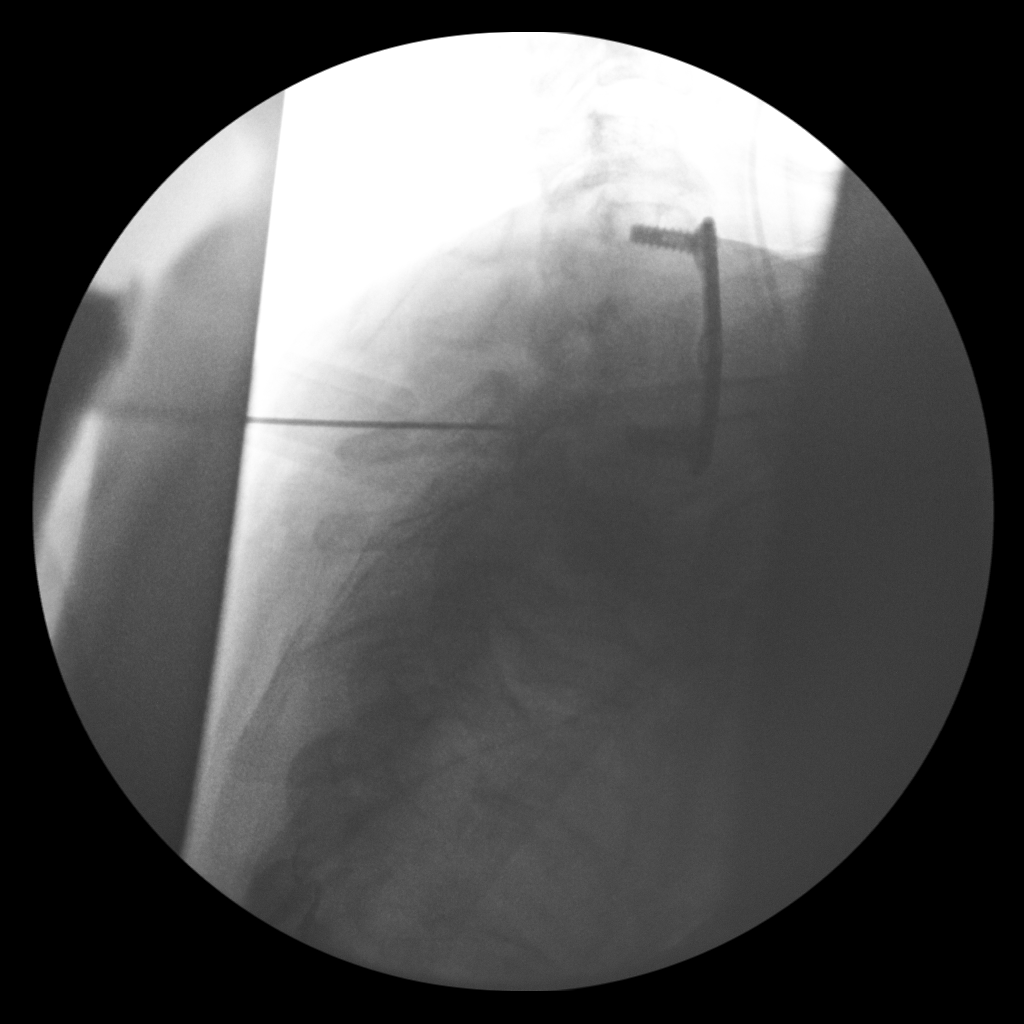

[1 of 1 positions shown; findings below may reference images not displayed]

FINDINGS: Single intraoperative fluoroscopic image demonstrates surgical probe
directed at approximately the C7 level.
IMPRESSION: Surgical localization as described above.

## 2022-07-31 DIAGNOSIS — M25521 Pain in right elbow: Secondary | ICD-10-CM | POA: Diagnosis not present

## 2022-07-31 DIAGNOSIS — R200 Anesthesia of skin: Secondary | ICD-10-CM | POA: Diagnosis not present

## 2022-07-31 DIAGNOSIS — M79641 Pain in right hand: Secondary | ICD-10-CM | POA: Diagnosis not present

## 2022-08-09 DIAGNOSIS — G5621 Lesion of ulnar nerve, right upper limb: Secondary | ICD-10-CM | POA: Diagnosis not present

## 2022-08-28 HISTORY — PX: ELBOW SURGERY: SHX618

## 2022-08-30 DIAGNOSIS — R200 Anesthesia of skin: Secondary | ICD-10-CM | POA: Diagnosis not present

## 2022-08-30 DIAGNOSIS — M79641 Pain in right hand: Secondary | ICD-10-CM | POA: Diagnosis not present

## 2022-08-30 DIAGNOSIS — G5621 Lesion of ulnar nerve, right upper limb: Secondary | ICD-10-CM | POA: Diagnosis not present

## 2022-08-30 DIAGNOSIS — M25521 Pain in right elbow: Secondary | ICD-10-CM | POA: Diagnosis not present

## 2022-10-03 DIAGNOSIS — G5621 Lesion of ulnar nerve, right upper limb: Secondary | ICD-10-CM | POA: Diagnosis not present

## 2022-10-13 ENCOUNTER — Other Ambulatory Visit: Payer: Medicare HMO

## 2022-10-18 DIAGNOSIS — M25621 Stiffness of right elbow, not elsewhere classified: Secondary | ICD-10-CM | POA: Diagnosis not present

## 2022-10-31 ENCOUNTER — Other Ambulatory Visit: Payer: Medicare HMO

## 2022-11-06 DIAGNOSIS — M25621 Stiffness of right elbow, not elsewhere classified: Secondary | ICD-10-CM | POA: Diagnosis not present

## 2022-11-13 ENCOUNTER — Other Ambulatory Visit (HOSPITAL_COMMUNITY): Payer: Self-pay

## 2022-11-13 DIAGNOSIS — I10 Essential (primary) hypertension: Secondary | ICD-10-CM | POA: Diagnosis not present

## 2022-11-13 DIAGNOSIS — J309 Allergic rhinitis, unspecified: Secondary | ICD-10-CM | POA: Diagnosis not present

## 2022-11-13 DIAGNOSIS — H9193 Unspecified hearing loss, bilateral: Secondary | ICD-10-CM | POA: Diagnosis not present

## 2022-11-13 DIAGNOSIS — E669 Obesity, unspecified: Secondary | ICD-10-CM | POA: Diagnosis not present

## 2022-11-13 DIAGNOSIS — E785 Hyperlipidemia, unspecified: Secondary | ICD-10-CM | POA: Diagnosis not present

## 2022-11-13 DIAGNOSIS — G8929 Other chronic pain: Secondary | ICD-10-CM | POA: Diagnosis not present

## 2022-11-15 ENCOUNTER — Other Ambulatory Visit: Payer: Self-pay

## 2022-11-15 ENCOUNTER — Other Ambulatory Visit (HOSPITAL_COMMUNITY): Payer: Self-pay

## 2022-11-15 DIAGNOSIS — M25621 Stiffness of right elbow, not elsewhere classified: Secondary | ICD-10-CM | POA: Diagnosis not present

## 2022-11-15 MED ORDER — ATORVASTATIN CALCIUM 20 MG PO TABS
20.0000 mg | ORAL_TABLET | Freq: Every evening | ORAL | 0 refills | Status: DC
  Filled 2022-11-15 (×2): qty 90, 90d supply, fill #0

## 2022-11-15 MED ORDER — VALSARTAN-HYDROCHLOROTHIAZIDE 160-12.5 MG PO TABS
1.0000 | ORAL_TABLET | Freq: Every day | ORAL | 0 refills | Status: DC
  Filled 2022-11-15 (×2): qty 90, 90d supply, fill #0

## 2022-12-04 DIAGNOSIS — M5030 Other cervical disc degeneration, unspecified cervical region: Secondary | ICD-10-CM | POA: Diagnosis not present

## 2022-12-04 DIAGNOSIS — E0590 Thyrotoxicosis, unspecified without thyrotoxic crisis or storm: Secondary | ICD-10-CM | POA: Diagnosis not present

## 2022-12-04 DIAGNOSIS — Z0000 Encounter for general adult medical examination without abnormal findings: Secondary | ICD-10-CM | POA: Diagnosis not present

## 2022-12-04 DIAGNOSIS — I700 Atherosclerosis of aorta: Secondary | ICD-10-CM | POA: Diagnosis not present

## 2022-12-04 DIAGNOSIS — E785 Hyperlipidemia, unspecified: Secondary | ICD-10-CM | POA: Diagnosis not present

## 2022-12-04 DIAGNOSIS — R7303 Prediabetes: Secondary | ICD-10-CM | POA: Diagnosis not present

## 2022-12-04 DIAGNOSIS — I10 Essential (primary) hypertension: Secondary | ICD-10-CM | POA: Diagnosis not present

## 2022-12-04 DIAGNOSIS — J309 Allergic rhinitis, unspecified: Secondary | ICD-10-CM | POA: Diagnosis not present

## 2022-12-04 DIAGNOSIS — R7309 Other abnormal glucose: Secondary | ICD-10-CM | POA: Diagnosis not present

## 2022-12-05 ENCOUNTER — Other Ambulatory Visit (HOSPITAL_COMMUNITY): Payer: Self-pay

## 2022-12-05 MED ORDER — VALSARTAN-HYDROCHLOROTHIAZIDE 160-12.5 MG PO TABS
1.0000 | ORAL_TABLET | Freq: Every day | ORAL | 1 refills | Status: DC
  Filled 2022-12-05 – 2023-02-08 (×2): qty 90, 90d supply, fill #0
  Filled 2023-04-28 – 2023-05-01 (×2): qty 90, 90d supply, fill #1

## 2022-12-05 MED ORDER — ATORVASTATIN CALCIUM 20 MG PO TABS
20.0000 mg | ORAL_TABLET | Freq: Every evening | ORAL | 3 refills | Status: AC
  Filled 2022-12-05 – 2023-02-08 (×2): qty 90, 90d supply, fill #0
  Filled 2023-04-28 – 2023-05-01 (×2): qty 90, 90d supply, fill #1

## 2022-12-05 MED ORDER — TRIAMCINOLONE ACETONIDE 55 MCG/ACT NA AERO
2.0000 | INHALATION_SPRAY | Freq: Every day | NASAL | 11 refills | Status: DC
  Filled 2022-12-05 – 2022-12-06 (×2): qty 1, 1d supply, fill #0

## 2022-12-06 ENCOUNTER — Other Ambulatory Visit (HOSPITAL_COMMUNITY): Payer: Self-pay

## 2022-12-16 DIAGNOSIS — R070 Pain in throat: Secondary | ICD-10-CM | POA: Diagnosis not present

## 2022-12-16 DIAGNOSIS — R0981 Nasal congestion: Secondary | ICD-10-CM | POA: Diagnosis not present

## 2022-12-16 DIAGNOSIS — J218 Acute bronchiolitis due to other specified organisms: Secondary | ICD-10-CM | POA: Diagnosis not present

## 2023-02-09 ENCOUNTER — Other Ambulatory Visit: Payer: Self-pay

## 2023-03-26 DIAGNOSIS — J309 Allergic rhinitis, unspecified: Secondary | ICD-10-CM | POA: Diagnosis not present

## 2023-03-26 DIAGNOSIS — H6121 Impacted cerumen, right ear: Secondary | ICD-10-CM | POA: Diagnosis not present

## 2023-03-26 DIAGNOSIS — M5136 Other intervertebral disc degeneration, lumbar region: Secondary | ICD-10-CM | POA: Diagnosis not present

## 2023-04-09 ENCOUNTER — Inpatient Hospital Stay: Admission: RE | Admit: 2023-04-09 | Payer: HMO | Source: Ambulatory Visit

## 2023-04-09 DIAGNOSIS — E349 Endocrine disorder, unspecified: Secondary | ICD-10-CM | POA: Diagnosis not present

## 2023-04-09 DIAGNOSIS — Z13820 Encounter for screening for osteoporosis: Secondary | ICD-10-CM

## 2023-04-09 DIAGNOSIS — N958 Other specified menopausal and perimenopausal disorders: Secondary | ICD-10-CM | POA: Diagnosis not present

## 2023-04-28 ENCOUNTER — Other Ambulatory Visit (HOSPITAL_COMMUNITY): Payer: Self-pay

## 2023-05-01 ENCOUNTER — Other Ambulatory Visit: Payer: Self-pay

## 2023-05-01 ENCOUNTER — Other Ambulatory Visit (HOSPITAL_COMMUNITY): Payer: Self-pay

## 2023-05-02 ENCOUNTER — Other Ambulatory Visit (HOSPITAL_COMMUNITY): Payer: Self-pay

## 2023-06-07 ENCOUNTER — Other Ambulatory Visit (HOSPITAL_COMMUNITY): Payer: Self-pay

## 2023-06-07 DIAGNOSIS — E785 Hyperlipidemia, unspecified: Secondary | ICD-10-CM | POA: Diagnosis not present

## 2023-06-07 DIAGNOSIS — Z23 Encounter for immunization: Secondary | ICD-10-CM | POA: Diagnosis not present

## 2023-06-07 DIAGNOSIS — I10 Essential (primary) hypertension: Secondary | ICD-10-CM | POA: Diagnosis not present

## 2023-06-07 DIAGNOSIS — I700 Atherosclerosis of aorta: Secondary | ICD-10-CM | POA: Diagnosis not present

## 2023-06-07 DIAGNOSIS — R7303 Prediabetes: Secondary | ICD-10-CM | POA: Diagnosis not present

## 2023-06-07 MED ORDER — VALSARTAN-HYDROCHLOROTHIAZIDE 160-12.5 MG PO TABS
1.0000 | ORAL_TABLET | Freq: Every day | ORAL | 1 refills | Status: DC
  Filled 2023-06-07 – 2023-08-06 (×2): qty 90, 90d supply, fill #0
  Filled 2023-11-02 – 2023-11-07 (×2): qty 90, 90d supply, fill #1

## 2023-06-07 MED ORDER — ATORVASTATIN CALCIUM 20 MG PO TABS
20.0000 mg | ORAL_TABLET | Freq: Every evening | ORAL | 3 refills | Status: DC
  Filled 2023-06-07 – 2023-08-07 (×2): qty 90, 90d supply, fill #0
  Filled 2023-11-01: qty 90, 90d supply, fill #1
  Filled 2024-01-30: qty 90, 90d supply, fill #2

## 2023-06-13 DIAGNOSIS — Z1330 Encounter for screening examination for mental health and behavioral disorders, unspecified: Secondary | ICD-10-CM | POA: Diagnosis not present

## 2023-06-13 DIAGNOSIS — Z01419 Encounter for gynecological examination (general) (routine) without abnormal findings: Secondary | ICD-10-CM | POA: Diagnosis not present

## 2023-06-13 DIAGNOSIS — Z1231 Encounter for screening mammogram for malignant neoplasm of breast: Secondary | ICD-10-CM | POA: Diagnosis not present

## 2023-06-13 DIAGNOSIS — Z13820 Encounter for screening for osteoporosis: Secondary | ICD-10-CM | POA: Diagnosis not present

## 2023-06-13 DIAGNOSIS — Z1211 Encounter for screening for malignant neoplasm of colon: Secondary | ICD-10-CM | POA: Diagnosis not present

## 2023-06-13 DIAGNOSIS — Z6831 Body mass index (BMI) 31.0-31.9, adult: Secondary | ICD-10-CM | POA: Diagnosis not present

## 2023-06-27 DIAGNOSIS — B0223 Postherpetic polyneuropathy: Secondary | ICD-10-CM | POA: Diagnosis not present

## 2023-08-06 ENCOUNTER — Other Ambulatory Visit: Payer: Self-pay

## 2023-08-06 ENCOUNTER — Other Ambulatory Visit (HOSPITAL_COMMUNITY): Payer: Self-pay

## 2023-08-08 ENCOUNTER — Other Ambulatory Visit (HOSPITAL_COMMUNITY): Payer: Self-pay

## 2023-09-06 ENCOUNTER — Ambulatory Visit (INDEPENDENT_AMBULATORY_CARE_PROVIDER_SITE_OTHER): Payer: HMO

## 2023-09-06 ENCOUNTER — Ambulatory Visit: Payer: HMO | Admitting: Podiatry

## 2023-09-06 ENCOUNTER — Encounter: Payer: Self-pay | Admitting: Podiatry

## 2023-09-06 DIAGNOSIS — M216X2 Other acquired deformities of left foot: Secondary | ICD-10-CM

## 2023-09-06 DIAGNOSIS — L84 Corns and callosities: Secondary | ICD-10-CM

## 2023-09-06 DIAGNOSIS — M778 Other enthesopathies, not elsewhere classified: Secondary | ICD-10-CM

## 2023-09-06 NOTE — Progress Notes (Signed)
 Subjective:   Patient ID: Courtney Schmidt, female   DOB: 73 y.o.   MRN: [id number]   HPI Chief Complaint  Patient presents with   Foot Pain    RM#11 Left foot pain hurting for about two weeks unable to walk bare foot.    73 year old female presents the office for above concerns.  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  This is an ongoing issue for quite some time.  No open lesion. No bleeding or any drainage.   Review of Systems  All other systems reviewed and are negative.  Past Medical History:  Diagnosis Date   Allergy    Arthritis    Diverticulitis    Family history of adverse reaction to anesthesia    sister,brother-, nephew -hard time waking up from surgery   GERD (gastroesophageal reflux disease)    use TUMS PRN    Hyperlipidemia    Hypertension     Past Surgical History:  Procedure Laterality Date   CHOLECYSTECTOMY     COLONOSCOPY  2004,2012   NECK SURGERY  8004,8000   1999 fusion   POLYPECTOMY  2004,2012   h xTA polyps   POSTERIOR CERVICAL FUSION/FORAMINOTOMY Right 05/20/2020   Procedure: FORAMINOTOMY RIGHT CERVICAL SEVEN- THORACIC ONE;  Surgeon: Carollee Lani BROCKS, DO;  Location: [facility] OR;  Service: Neurosurgery;  Laterality: Right;   TUBAL LIGATION       Current Outpatient Medications:    acetaminophen  (TYLENOL ) 500 MG tablet, 1 tablet as needed, Disp: , Rfl:    atorvastatin  (LIPITOR) 20 MG tablet, Take 20 mg by mouth daily., Disp: , Rfl:    atorvastatin  (LIPITOR) 20 MG tablet, Take 1 tablet (20 mg total) by mouth every evening., Disp: 90 tablet, Rfl: 3   atorvastatin  (LIPITOR) 20 MG tablet, Take 1 tablet (20 mg total) by mouth every evening., Disp: 90 tablet, Rfl: 3   cyclobenzaprine (FLEXERIL) 10 MG tablet, Take 10 mg by mouth daily as needed for pain., Disp: , Rfl:    fluticasone (FLONASE) 50 MCG/ACT nasal spray, Place 1 spray into both nostrils daily as needed for allergies., Disp: , Rfl:    loratadine (CLARITIN) 10 MG tablet, Take 10 mg by mouth  daily., Disp: , Rfl:    ondansetron  (ZOFRAN  ODT) 4 MG disintegrating tablet, Take 1 tablet (4 mg total) by mouth every 8 (eight) hours as needed for nausea or vomiting., Disp: 20 tablet, Rfl: 0   triamcinolone  (NASACORT ) 55 MCG/ACT AERO nasal inhaler, Place 2 sprays into each nostril daily., Disp: 1 each, Rfl: 11   valsartan -hydrochlorothiazide  (DIOVAN -HCT) 160-12.5 MG tablet, Take 1 tablet by mouth daily., Disp: , Rfl:    valsartan -hydrochlorothiazide  (DIOVAN -HCT) 160-12.5 MG tablet, Take 1 tablet by mouth daily., Disp: 90 tablet, Rfl: 1  Allergies  Allergen Reactions   Codeine Nausea And Vomiting   E-Mycin [Erythromycin] Nausea And Vomiting   Sulfa Antibiotics Nausea And Vomiting   Aleve [Naproxen]     Other reaction(s): stomach issues   Cephalexin     Other reaction(s): stomach irritation   Ibuprofen     Other reaction(s): stomach issues   Metronidazole     Other reaction(s): nausea/decreased appetite          Objective:  Physical Exam  General: AAO x3, NAD  Dermatological: Left foot submetatarsal 2 is a hyperkeratotic lesion without any underlying ulceration, drainage or signs of infection.  No open lesions.  Vascular: Dorsalis Pedis artery and Posterior Tibial artery pedal pulses are 2/4 bilateral with  immedate capillary fill time. There is no pain with calf compression, swelling, warmth, erythema.   Neruologic: Grossly intact via light touch bilateral.   Musculoskeletal: Prominence of metatarsal head plantarly after debridement.     Assessment:   Hyperkeratotic lesion left foot     Plan:  -Treatment options discussed including all alternatives, risks, and complications -Etiology of symptoms were discussed -X-rays were obtained and reviewed with the patient. 3 view of the foot were obtained.  Mild bunions present.  No evidence of acute fracture. -Sharply debrided hyperkeratotic lesion with any complications or bleeding.  Discussed moisturizer, ongoing.  Return if  symptoms worsen or fail to improve.  Donnice JONELLE Fees DPM

## 2023-10-03 ENCOUNTER — Other Ambulatory Visit: Payer: Self-pay

## 2023-10-03 ENCOUNTER — Other Ambulatory Visit (HOSPITAL_COMMUNITY): Payer: Self-pay

## 2023-10-03 MED ORDER — TRIAMCINOLONE ACETONIDE 55 MCG/ACT NA AERO
2.0000 | INHALATION_SPRAY | Freq: Every day | NASAL | 12 refills | Status: DC
  Filled 2023-10-03 – 2024-01-30 (×3): qty 16.9, 30d supply, fill #0

## 2023-10-04 ENCOUNTER — Encounter: Payer: Self-pay | Admitting: Pharmacist

## 2023-10-04 ENCOUNTER — Other Ambulatory Visit: Payer: Self-pay

## 2023-10-09 ENCOUNTER — Other Ambulatory Visit: Payer: Self-pay

## 2023-10-27 DIAGNOSIS — N3001 Acute cystitis with hematuria: Secondary | ICD-10-CM | POA: Diagnosis not present

## 2023-11-02 ENCOUNTER — Other Ambulatory Visit: Payer: Self-pay

## 2023-11-02 ENCOUNTER — Other Ambulatory Visit (HOSPITAL_COMMUNITY): Payer: Self-pay

## 2023-11-03 ENCOUNTER — Other Ambulatory Visit (HOSPITAL_COMMUNITY): Payer: Self-pay

## 2023-11-07 ENCOUNTER — Other Ambulatory Visit (HOSPITAL_COMMUNITY): Payer: Self-pay

## 2023-11-07 ENCOUNTER — Other Ambulatory Visit: Payer: Self-pay

## 2023-12-18 ENCOUNTER — Other Ambulatory Visit: Payer: Self-pay | Admitting: Family Medicine

## 2023-12-18 ENCOUNTER — Other Ambulatory Visit (HOSPITAL_COMMUNITY): Payer: Self-pay

## 2023-12-18 DIAGNOSIS — R1032 Left lower quadrant pain: Secondary | ICD-10-CM

## 2023-12-18 DIAGNOSIS — J309 Allergic rhinitis, unspecified: Secondary | ICD-10-CM | POA: Diagnosis not present

## 2023-12-18 DIAGNOSIS — Z0000 Encounter for general adult medical examination without abnormal findings: Secondary | ICD-10-CM | POA: Diagnosis not present

## 2023-12-18 DIAGNOSIS — E785 Hyperlipidemia, unspecified: Secondary | ICD-10-CM | POA: Diagnosis not present

## 2023-12-18 DIAGNOSIS — Z8639 Personal history of other endocrine, nutritional and metabolic disease: Secondary | ICD-10-CM | POA: Diagnosis not present

## 2023-12-18 DIAGNOSIS — R7303 Prediabetes: Secondary | ICD-10-CM | POA: Diagnosis not present

## 2023-12-18 DIAGNOSIS — M5136 Other intervertebral disc degeneration, lumbar region with discogenic back pain only: Secondary | ICD-10-CM | POA: Diagnosis not present

## 2023-12-18 DIAGNOSIS — I700 Atherosclerosis of aorta: Secondary | ICD-10-CM | POA: Diagnosis not present

## 2023-12-18 DIAGNOSIS — I10 Essential (primary) hypertension: Secondary | ICD-10-CM | POA: Diagnosis not present

## 2023-12-18 DIAGNOSIS — Z860101 Personal history of adenomatous and serrated colon polyps: Secondary | ICD-10-CM | POA: Diagnosis not present

## 2023-12-18 DIAGNOSIS — M5030 Other cervical disc degeneration, unspecified cervical region: Secondary | ICD-10-CM | POA: Diagnosis not present

## 2023-12-18 DIAGNOSIS — Z23 Encounter for immunization: Secondary | ICD-10-CM | POA: Diagnosis not present

## 2023-12-18 MED ORDER — NYSTATIN 100000 UNIT/GM EX POWD
CUTANEOUS | 5 refills | Status: DC
  Filled 2023-12-18: qty 120, 30d supply, fill #0

## 2023-12-19 ENCOUNTER — Other Ambulatory Visit (HOSPITAL_COMMUNITY): Payer: Self-pay

## 2023-12-20 ENCOUNTER — Other Ambulatory Visit (HOSPITAL_COMMUNITY): Payer: Self-pay

## 2023-12-24 ENCOUNTER — Encounter: Payer: Self-pay | Admitting: Radiology

## 2023-12-24 ENCOUNTER — Ambulatory Visit
Admission: RE | Admit: 2023-12-24 | Discharge: 2023-12-24 | Disposition: A | Discharge status: Home / Self Care | Source: Ambulatory Visit | Attending: Family Medicine | Admitting: Family Medicine

## 2023-12-24 DIAGNOSIS — R1032 Left lower quadrant pain: Secondary | ICD-10-CM

## 2023-12-24 DIAGNOSIS — K8689 Other specified diseases of pancreas: Secondary | ICD-10-CM | POA: Diagnosis not present

## 2023-12-24 DIAGNOSIS — D3502 Benign neoplasm of left adrenal gland: Secondary | ICD-10-CM | POA: Diagnosis not present

## 2023-12-24 MED ORDER — IOPAMIDOL (ISOVUE-300) INJECTION 61%
100.0000 mL | Freq: Once | INTRAVENOUS | Status: AC | PRN
  Administered 2023-12-24: 100 mL via INTRAVENOUS

## 2023-12-27 ENCOUNTER — Encounter: Payer: Self-pay | Admitting: Physician Assistant

## 2024-01-29 ENCOUNTER — Other Ambulatory Visit (HOSPITAL_COMMUNITY): Payer: Self-pay

## 2024-01-30 ENCOUNTER — Other Ambulatory Visit (HOSPITAL_COMMUNITY): Payer: Self-pay

## 2024-01-31 ENCOUNTER — Other Ambulatory Visit (HOSPITAL_COMMUNITY): Payer: Self-pay

## 2024-01-31 ENCOUNTER — Other Ambulatory Visit: Payer: Self-pay

## 2024-02-01 ENCOUNTER — Other Ambulatory Visit (HOSPITAL_COMMUNITY): Payer: Self-pay

## 2024-02-04 NOTE — Progress Notes (Unsigned)
 Courtney Canard, PA-Schmidt [address] [place], [place]  [address] Phone: [phone]   Gastroenterology Consultation  Referring Provider:     Victorio Grave, MD Primary Care Physician:  Courtney Grave, MD Primary Gastroenterologist:  Courtney Canard, PA-Schmidt / Dr. Laurell Schmidt  Reason for Consultation:     3-year repeat colonoscopy, LLQ pain        HPI:   Courtney Schmidt is a 73 y.o. y/o female referred for consultation & management  by Courtney Grave, MD.    Patient saw her PCP, Dr. Camilo Schmidt, May 2025 for complete physical.  She had been having some intermittent left lower quadrant abdominal pain for several weeks.  On exam she had moderate LLQ tenderness.  Abdominal pelvic CT showed possible diverticulitis.  She was treated with Augmentin  875 Mg twice daily x 7 days starting 12/26/2023.  LLQ pain improved, however did not resolve.  She is currently having LLQ pain and tenderness which is a 4-5 out of 10.  She denies rectal bleeding, weight loss, nausea, vomiting, fever, or chills.  Currently having bowel movement daily which is formed.  Occasional loose stool.  Denies any constipation.  Occasionally it is hard to get clean with wiping after having loose bowel movement.  She tried Metamucil which increased her LLQ pain and lower abdominal cramping and discontinued.  12/26/23 CT abdomen pelvis with contrast (to evaluate LLQ pain): Previous cholecystectomy.  Normal appendix.  Slight thickening along the descending colon with some vascular engorgement, concerning for diverticulitis.  Few diverticula along the left side of the colon.  Atrophic pancreas with several small cystic areas, present and stable since 2017.  Repeat follow-up imaging in 1 to 2 years.  Stable left adrenal nodule demonstrating long-term stability.  Likely benign adenoma.  12/2020 last colonoscopy by Dr. Bridgett Schmidt: 5 small (3 mm to 6 mm) tubular adenoma polyps removed.  Sigmoid diverticulosis.  Good prep.  3-year repeat (due  12/2023).  06/2015 colonoscopy: 5 mm sessile polyp removed.  5-year repeat.   Past Medical History:  Diagnosis Date   Allergy    Arthritis    Colon polyps    Diverticulitis    Family history of adverse reaction to anesthesia    sister,brother-, nephew -hard time waking up from surgery   Gallstones    GERD (gastroesophageal reflux disease)    use TUMS PRN    Hyperlipidemia    Hypertension    Pneumonia     Past Surgical History:  Procedure Laterality Date   CERVICAL SPINE SURGERY  [phone]   1999 fusion   CHOLECYSTECTOMY     COLONOSCOPY  2004,2012   ELBOW SURGERY Right 2024   POLYPECTOMY  6213,0865   h xTA polyps   POSTERIOR CERVICAL FUSION/FORAMINOTOMY Right 05/20/2020   Procedure: FORAMINOTOMY RIGHT CERVICAL SEVEN- THORACIC ONE;  Surgeon: Courtney Bridgeman, Courtney Schmidt;  Location: [facility] OR;  Service: Neurosurgery;  Laterality: Right;   TUBAL LIGATION      Prior to Admission medications   Medication Sig Start Date End Date Taking? Authorizing Provider  acetaminophen  (TYLENOL ) 500 MG tablet 1 tablet as needed    [provider]  atorvastatin  (LIPITOR) 20 MG tablet Take 20 mg by mouth daily.    [provider]  atorvastatin  (LIPITOR) 20 MG tablet Take 1 tablet (20 mg total) by mouth every evening. 12/04/22     atorvastatin  (LIPITOR) 20 MG tablet Take 1 tablet (20 mg total) by mouth every evening. 06/07/23     cyclobenzaprine (  FLEXERIL) 10 MG tablet Take 10 mg by mouth daily as needed for pain. 04/15/20   [provider]  fluticasone (FLONASE) 50 MCG/ACT nasal spray Place 1 spray into both nostrils daily as needed for allergies. 04/24/20   [provider]  loratadine (CLARITIN) 10 MG tablet Take 10 mg by mouth daily.    [provider]  nystatin  (MYCOSTATIN /NYSTOP ) powder Apply to the affected area twice daily as needed for yeast 12/18/23     ondansetron  (ZOFRAN  ODT) 4 MG disintegrating tablet Take 1 tablet (4 mg total) by mouth every 8 (eight) hours  as needed for nausea or vomiting. 05/20/20   Courtney Schmidt, Courtney Schmidt, Courtney Schmidt  triamcinolone  (NASACORT ) 55 MCG/ACT AERO nasal inhaler Place 2 sprays into each nostril daily. 12/04/22     triamcinolone  (NASACORT ) 55 MCG/ACT AERO nasal inhaler Place 2 sprays into each nostril daily. 10/03/23   Courtney Grave, MD  valsartan -hydrochlorothiazide  (DIOVAN -HCT) 160-12.5 MG tablet Take 1 tablet by mouth daily. 04/20/20   [provider]  valsartan -hydrochlorothiazide  (DIOVAN -HCT) 160-12.5 MG tablet Take 1 tablet by mouth daily. 06/07/23       Family History  Problem Relation Age of Onset   Aneurysm Mother    Heart disease Mother    Pancreatic cancer Father    Heart disease Father    Colon polyps Sister    Heart attack Sister    Colon polyps Brother    Hyperlipidemia Brother    Heart disease Brother    Hyperlipidemia Brother    Diabetes Brother    Diabetes Brother    Hyperlipidemia Brother    Breast cancer Cousin    Crohn's disease Grandchild    Colon cancer Neg Hx    Rectal cancer Neg Hx    Stomach cancer Neg Hx    Esophageal cancer Neg Hx      Social History   Tobacco Use   Smoking status: Never   Smokeless tobacco: Never  Vaping Use   Vaping status: Never Used  Substance Use Topics   Alcohol use: No    Alcohol/week: 0.0 standard drinks of alcohol   Drug use: No    Allergies as of 02/05/2024 - Review Complete 02/05/2024  Allergen Reaction Noted   Codeine Nausea And Vomiting 05/23/2012   E-mycin [erythromycin] Nausea And Vomiting 05/23/2012   Sulfa antibiotics Nausea And Vomiting 05/23/2012   Aleve [naproxen]  09/28/2020   Cephalexin  09/28/2020   Ibuprofen  09/28/2020   Metronidazole  09/28/2020    Review of Systems:    All systems reviewed and negative except where noted in HPI.   Physical Exam:  BP 120/80 (BP Location: Left Arm, Patient Position: Sitting, Cuff Size: Normal)   Pulse 72   Ht 5\' 1"  (1.549 m) Comment: height measured without shoes  Wt 168 lb 3 oz (76.3 kg)    BMI 31.78 kg/m  No LMP recorded. Patient is postmenopausal.  General:   Alert,  Well-developed, well-nourished, pleasant and cooperative in NAD Lungs:  Respirations even and unlabored.  Clear throughout to auscultation.   No wheezes, crackles, or rhonchi. No acute distress. Heart:  Regular rate and rhythm; no murmurs, clicks, rubs, or gallops. Abdomen:  Normal bowel sounds.  No bruits.  Soft, and non-distended without masses, hepatosplenomegaly or hernias noted.  Moderate LLQ Tenderness.  Rest of abdomen is not tender.  No guarding or rebound tenderness.    Neurologic:  Alert and oriented x3;  grossly normal neurologically. Psych:  Alert and cooperative. Normal mood and affect.  Imaging Studies: No results found.  Labs: CBC    Component Value Date/Time   WBC 6.0 05/19/2020 1353   RBC 4.85 05/19/2020 1353   HGB 14.2 05/19/2020 1353   HCT 43.5 05/19/2020 1353   PLT 306 05/19/2020 1353   MCV 89.7 05/19/2020 1353   MCH 29.3 05/19/2020 1353   MCHC 32.6 05/19/2020 1353   RDW 12.2 05/19/2020 1353    CMP     Component Value Date/Time   NA 139 05/19/2020 1353   K 3.7 05/19/2020 1353   CL 102 05/19/2020 1353   CO2 24 05/19/2020 1353   GLUCOSE 99 05/19/2020 1353   BUN 10 05/19/2020 1353   CREATININE 0.91 05/19/2020 1353   CALCIUM  9.8 05/19/2020 1353   GFRNONAA >60 05/19/2020 1353   GFRAA >60 05/19/2020 1353    Assessment and Plan:   Juwana Thoreson is a 72 y.o. y/o female has been referred for:   Diverticulitis - Improved, yet not resolved; Still has moderate LLQ tenderness on exam today. - Rx Augmentin  875mg  BID x 10 days, #20, No refills. - Soft diet for 1-2 weeks. - Close f/u in 2-3 weeks. - If LLQ pain persists, then repeat CT is next step. - Wait 6-8 weeks until after diverticulitis resolves in order to perform Colonoscopy.  Irregular BM - Currently improved. - After diverticulitis resolves, then recommend start Benefiber 1 tablespoon in a drink once daily.  History of  Colon Polyps - 12/2020 last colonoscopy by Dr. Bridgett Schmidt: 5 small (3 mm to 6 mm) tubular adenoma polyps removed.  Sigmoid diverticulosis.  - 3 year repeat colonoscopy is due, however we need to postpone scheduling Colonoscopy until diverticulitis has resolved.  Follow up in 2 - 3 weeks with TG.  Courtney Canard, PA-Schmidt

## 2024-02-05 ENCOUNTER — Ambulatory Visit: Admitting: Physician Assistant

## 2024-02-05 ENCOUNTER — Encounter: Payer: Self-pay | Admitting: Physician Assistant

## 2024-02-05 ENCOUNTER — Other Ambulatory Visit (HOSPITAL_COMMUNITY): Payer: Self-pay

## 2024-02-05 VITALS — BP 120/80 | HR 72 | Ht 61.0 in | Wt 168.2 lb

## 2024-02-05 DIAGNOSIS — K5732 Diverticulitis of large intestine without perforation or abscess without bleeding: Secondary | ICD-10-CM

## 2024-02-05 DIAGNOSIS — R198 Other specified symptoms and signs involving the digestive system and abdomen: Secondary | ICD-10-CM

## 2024-02-05 DIAGNOSIS — R194 Change in bowel habit: Secondary | ICD-10-CM | POA: Diagnosis not present

## 2024-02-05 DIAGNOSIS — Z860100 Personal history of colon polyps, unspecified: Secondary | ICD-10-CM

## 2024-02-05 DIAGNOSIS — Z8719 Personal history of other diseases of the digestive system: Secondary | ICD-10-CM

## 2024-02-05 DIAGNOSIS — Z860101 Personal history of adenomatous and serrated colon polyps: Secondary | ICD-10-CM | POA: Diagnosis not present

## 2024-02-05 MED ORDER — AMOXICILLIN-POT CLAVULANATE 875-125 MG PO TABS: 1.0000 | ORAL_TABLET | Freq: Two times a day (BID) | ORAL | 0 refills | Status: DC

## 2024-02-05 MED ORDER — VALSARTAN-HYDROCHLOROTHIAZIDE 160-12.5 MG PO TABS
1.0000 | ORAL_TABLET | Freq: Every day | ORAL | 1 refills | Status: DC
  Filled 2024-02-05: qty 90, 90d supply, fill #0
  Filled 2024-04-29: qty 90, 90d supply, fill #1

## 2024-02-05 NOTE — Progress Notes (Signed)
 Addendum: Reviewed and agree with assessment and management plan. Asha Grumbine, Carie Caddy, MD

## 2024-02-05 NOTE — Patient Instructions (Addendum)
 We have sent the following medications to your pharmacy for you to pick up at your convenience: Augmentin  875-125mg  twice daily   Please follow up sooner if symptoms increase or worsen  Due to recent changes in healthcare laws, you may see the results of your imaging and laboratory studies on MyChart before your provider has had a chance to review them.  We understand that in some cases there may be results that are confusing or concerning to you. Not all laboratory results come back in the same time frame and the provider may be waiting for multiple results in order to interpret others.  Please give us  48 hours in order for your provider to thoroughly review all the results before contacting the office for clarification of your results.   Thank you for trusting me with your gastrointestinal care!   Brigitte Canard, PA-C _______________________________________________________  If your blood pressure at your visit was 140/90 or greater, please contact your primary care physician to follow up on this.  _______________________________________________________  If you are age 50 or older, your body mass index should be between 23-30. Your Body mass index is 31.78 kg/m. If this is out of the aforementioned range listed, please consider follow up with your Primary Care Provider.  If you are age 6 or younger, your body mass index should be between 19-25. Your Body mass index is 31.78 kg/m. If this is out of the aformentioned range listed, please consider follow up with your Primary Care Provider.   ________________________________________________________  The [facility] providers would like to encourage you to use MYCHART to communicate with providers for non-urgent requests or questions.  Due to long hold times on the telephone, sending your provider a message by [facility] may be a faster and more efficient way to get a response.  Please allow 48 business hours for a response.  Please remember that this  is for non-urgent requests.  _______________________________________________________

## 2024-02-19 ENCOUNTER — Ambulatory Visit: Admitting: Physician Assistant

## 2024-03-17 NOTE — Progress Notes (Signed)
 bti   Ellouise Console, PA-C [address] [place], [place]  [address] Phone: [phone]   Primary Care Physician: Teresa Channel, MD  Primary Gastroenterologist:  Ellouise Console, PA-C / Dr. Gordy Starch   Chief Complaint: Followup of Diverticulitis       HPI:   Courtney Schmidt is a 73 y.o. female, established patient of Dr. Starch, presents for 6-week follow-up of diverticulitis.  12/26/23 - Treated with Augmentin  875 twice daily x 7 days.  02/05/24 - Treated with Augmentin  875 Mg twice daily x 10 days.    Current symptoms: Patient states LLQ pain has improved, but not resolved.  She still has LLQ pain and tenderness.  It is 5-6 out of 10.  Worse laying down at night and worse laying on her left side.  Takes Tylenol  which helps.  Denies fever or chills.  Has mild nausea, but no vomiting.  She is having a normal soft formed bowel movement once or twice daily.  Denies rectal bleeding or weight loss.  She tolerated Augmentin  well and finished antibiotics.  She also has some suprapubic pain and is worried about UTI.  She states LLQ pain radiates to the suprapubic area.  Denies dysuria or hematuria.  12/24/2023 CT abdomen pelvis with contrast (ordered by PCP Dr. Channel Teresa for LLQ pain): Previous cholecystectomy.  Normal appendix.  Slight thickening along the descending colon with some vascular engorgement, concerning for diverticulitis.  Few diverticula along the left side of the colon.  Atrophic pancreas with several small cystic areas, present and stable since 2017.  Repeat follow-up imaging in 1 to 2 years.  Stable left adrenal nodule demonstrating long-term stability.  Likely benign adenoma.   12/2020 last colonoscopy by Dr. Starch: 5 small (3 mm to 6 mm) tubular adenoma polyps removed.  Sigmoid diverticulosis.  Good prep.  3-year repeat (due 12/2023).  Personal history of non- advanced adenoma ( SSP and adenoma in 2004, adenoma 2012, sessile polyp 2016) , Family history of advanced adenomas of the  colon in multiple first- degree relatives ( brother and sister).  Of note: Patient states she is not allergic to Cipro  or metronidazole.  She cannot tolerate sulfa antibiotics which cause nausea and vomiting.  Current Outpatient Medications  Medication Sig Dispense Refill   acetaminophen  (TYLENOL ) 500 MG tablet 1 tablet as needed     amoxicillin -clavulanate (AUGMENTIN ) 875-125 MG tablet Take 1 tablet by mouth 2 (two) times daily. 20 tablet 0   atorvastatin  (LIPITOR) 20 MG tablet Take 20 mg by mouth daily.     cyclobenzaprine (FLEXERIL) 10 MG tablet Take 10 mg by mouth daily as needed for pain.     fluticasone (FLONASE) 50 MCG/ACT nasal spray Place 1 spray into both nostrils daily as needed for allergies.     loratadine (CLARITIN) 10 MG tablet Take 10 mg by mouth daily.     valsartan -hydrochlorothiazide  (DIOVAN -HCT) 160-12.5 MG tablet Take 1 tablet by mouth daily.     atorvastatin  (LIPITOR) 20 MG tablet Take 1 tablet (20 mg total) by mouth every evening. 90 tablet 3   atorvastatin  (LIPITOR) 20 MG tablet Take 1 tablet (20 mg total) by mouth every evening. 90 tablet 3   valsartan -hydrochlorothiazide  (DIOVAN -HCT) 160-12.5 MG tablet Take 1 tablet by mouth daily for 90 days 90 tablet 1   No current facility-administered medications for this visit.    Allergies as of 03/18/2024 - Review Complete 03/18/2024  Allergen Reaction Noted   Codeine Nausea And Vomiting 05/23/2012   E-mycin [  erythromycin] Nausea And Vomiting 05/23/2012   Sulfa antibiotics Nausea And Vomiting 05/23/2012   Aleve [naproxen]  09/28/2020   Cephalexin  09/28/2020   Ibuprofen  09/28/2020    Past Medical History:  Diagnosis Date   Allergy    Arthritis    Colon polyps    Diverticulitis    Family history of adverse reaction to anesthesia    sister,brother-, nephew -hard time waking up from surgery   Gallstones    GERD (gastroesophageal reflux disease)    use TUMS PRN    Hyperlipidemia    Hypertension    Pneumonia      Past Surgical History:  Procedure Laterality Date   CERVICAL SPINE SURGERY  8004,8000   1999 fusion   CHOLECYSTECTOMY     COLONOSCOPY  2004,2012   ELBOW SURGERY Right 2024   POLYPECTOMY  7995,7987   h xTA polyps   POSTERIOR CERVICAL FUSION/FORAMINOTOMY Right 05/20/2020   Procedure: FORAMINOTOMY RIGHT CERVICAL SEVEN- THORACIC ONE;  Surgeon: Carollee Lani BROCKS, DO;  Location: [facility] OR;  Service: Neurosurgery;  Laterality: Right;   TUBAL LIGATION      Review of Systems:    All systems reviewed and negative except where noted in HPI.    Physical Exam:  BP 120/70   Pulse 70   Ht 5' 1 (1.549 m)   Wt 170 lb (77.1 kg)   BMI 32.12 kg/m  No LMP recorded. Patient is postmenopausal.  General: Well-nourished, well-developed in no acute distress.  Lungs: Clear to auscultation bilaterally. Non-labored. Heart: Regular rate and rhythm, no murmurs rubs or gallops.  Abdomen: Bowel sounds are normal; Abdomen is Soft; No hepatosplenomegaly, masses or hernias; moderate LLQ and suprapubic abdominal Tenderness; no upper abdominal tenderness.  Mild guarding.  No rebound tenderness. Neuro: Alert and oriented x 3.  Grossly intact.  Psych: Alert and cooperative, normal mood and affect.   Imaging Studies: No results found.  Labs: CBC    Component Value Date/Time   WBC 6.0 05/19/2020 1353   RBC 4.85 05/19/2020 1353   HGB 14.2 05/19/2020 1353   HCT 43.5 05/19/2020 1353   PLT 306 05/19/2020 1353   MCV 89.7 05/19/2020 1353   MCH 29.3 05/19/2020 1353   MCHC 32.6 05/19/2020 1353   RDW 12.2 05/19/2020 1353    CMP     Component Value Date/Time   NA 139 05/19/2020 1353   K 3.7 05/19/2020 1353   CL 102 05/19/2020 1353   CO2 24 05/19/2020 1353   GLUCOSE 99 05/19/2020 1353   BUN 10 05/19/2020 1353   CREATININE 0.91 05/19/2020 1353   CALCIUM  9.8 05/19/2020 1353   GFRNONAA >60 05/19/2020 1353   GFRAA >60 05/19/2020 1353     Assessment and Plan:   Courtney Schmidt is a 73 y.o. y/o female  presents for follow-up of:  1.  Descending colon diverticulitis -improved status post 17-day treatment with Augmentin  since end of April.  I am concerned about persistent LLQ pain and tenderness.  Scheduling repeat CT scan.  Evaluate for persistent diverticulitis or abscess. - If CT shows persistent diverticulitis then I will treat with Cipro  and Flagyl.  She states she is not allergic to these antibiotics.  She has sulfa allergy. - If no diverticulitis then schedule colonoscopy.  2.  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 These have been present and stable since 2017.  - Recommend continued follow up imaging in 1-2 years.  3.  History of adenomatous colon polyps (3-year repeat colonoscopy is due) -  If abdominal pelvic CT shows no diverticulitis, then I will schedule colonoscopy.  4.  Suprapubic and LLQ pain -Labs CBC, CMP - Urinalysis with micro and reflex to culture if positive.   Ellouise Console, PA-C  Follow up based on above test results.

## 2024-03-18 ENCOUNTER — Other Ambulatory Visit (INDEPENDENT_AMBULATORY_CARE_PROVIDER_SITE_OTHER)

## 2024-03-18 ENCOUNTER — Encounter: Payer: Self-pay | Admitting: Physician Assistant

## 2024-03-18 ENCOUNTER — Ambulatory Visit: Admitting: Physician Assistant

## 2024-03-18 VITALS — BP 120/70 | HR 70 | Ht 61.0 in | Wt 170.0 lb

## 2024-03-18 DIAGNOSIS — R1032 Left lower quadrant pain: Secondary | ICD-10-CM

## 2024-03-18 DIAGNOSIS — K8689 Other specified diseases of pancreas: Secondary | ICD-10-CM | POA: Diagnosis not present

## 2024-03-18 DIAGNOSIS — R198 Other specified symptoms and signs involving the digestive system and abdomen: Secondary | ICD-10-CM

## 2024-03-18 DIAGNOSIS — Z8719 Personal history of other diseases of the digestive system: Secondary | ICD-10-CM

## 2024-03-18 DIAGNOSIS — R102 Pelvic and perineal pain: Secondary | ICD-10-CM | POA: Diagnosis not present

## 2024-03-18 DIAGNOSIS — Z860101 Personal history of adenomatous and serrated colon polyps: Secondary | ICD-10-CM | POA: Diagnosis not present

## 2024-03-18 DIAGNOSIS — Z860100 Personal history of colon polyps, unspecified: Secondary | ICD-10-CM

## 2024-03-18 LAB — COMPREHENSIVE METABOLIC PANEL WITH GFR
ALT: 16 U/L (ref 0–35)
AST: 17 U/L (ref 0–37)
Albumin: 4.3 g/dL (ref 3.5–5.2)
Alkaline Phosphatase: 66 U/L (ref 39–117)
BUN: 15 mg/dL (ref 6–23)
CO2: 32 meq/L (ref 19–32)
Calcium: 10.2 mg/dL (ref 8.4–10.5)
Chloride: 101 meq/L (ref 96–112)
Creatinine, Ser: 0.87 mg/dL (ref 0.40–1.20)
GFR: 66.38 mL/min (ref >60.00)
Glucose, Bld: 109 mg/dL — ABNORMAL HIGH (ref 70–99)
Potassium: 3.4 meq/L — ABNORMAL LOW (ref 3.5–5.1)
Sodium: 140 meq/L (ref 135–145)
Total Bilirubin: 0.7 mg/dL (ref 0.2–1.2)
Total Protein: 7.2 g/dL (ref 6.0–8.3)

## 2024-03-18 LAB — CBC WITH DIFFERENTIAL/PLATELET
Basophils Absolute: 0.1 K/uL (ref 0.0–0.1)
Basophils Relative: 1.4 % (ref 0.0–3.0)
Eosinophils Absolute: 0.1 K/uL (ref 0.0–0.7)
Eosinophils Relative: 1.5 % (ref 0.0–5.0)
HCT: 43.5 % (ref 36.0–46.0)
Hemoglobin: 14.5 g/dL (ref 12.0–15.0)
Lymphocytes Relative: 35.3 % (ref 12.0–46.0)
Lymphs Abs: 2.3 K/uL (ref 0.7–4.0)
MCHC: 33.4 g/dL (ref 30.0–36.0)
MCV: 89.1 fl (ref 78.0–100.0)
Monocytes Absolute: 0.5 K/uL (ref 0.1–1.0)
Monocytes Relative: 7.6 % (ref 3.0–12.0)
Neutro Abs: 3.5 K/uL (ref 1.4–7.7)
Neutrophils Relative %: 54.2 % (ref 43.0–77.0)
Platelets: 259 K/uL (ref 150.0–400.0)
RBC: 4.88 Mil/uL (ref 3.87–5.11)
RDW: 12.6 % (ref 11.5–15.5)
WBC: 6.4 K/uL (ref 4.0–10.5)

## 2024-03-18 NOTE — Patient Instructions (Addendum)
 Your provider has requested that you go to the basement level for lab work before leaving today. Press B on the elevator. The lab is located at the first door on the left as you exit the elevator.  You have been scheduled for a CT scan of the abdomen and pelvis at [facility], 1st floor Radiology. You are scheduled on 03/26/24 at 1:30 pm. You should arrive 15 minutes prior to your appointment time for registration.     Please follow the written instructions below on the day of your exam:   1) Do not eat anything after 10:30 am (4 hours prior to your test)   2) You will drink 1 bottle of contrast @ 1:30 pm (2 hours prior to your exam)    3) Drink 1 bottle of contrast @ 2:30 pm (1 hour prior to your exam)   You may take any medications as prescribed with a small amount of water, if necessary. If you take any of the following medications: METFORMIN, GLUCOPHAGE, GLUCOVANCE, AVANDAMET, RIOMET, FORTAMET, ACTOPLUS MET, JANUMET, GLUMETZA or METAGLIP, you MAY be asked to HOLD this medication 48 hours AFTER the exam.   The purpose of you drinking the oral contrast is to aid in the visualization of your intestinal tract. The contrast solution may cause some diarrhea. Depending on your individual set of symptoms, you may also receive an intravenous injection of x-ray contrast/dye. Plan on being at [facility] for 45 minutes or longer, depending on the type of exam you are having performed.   If you have any questions regarding your exam or if you need to reschedule, you may call [facility] at [phone] between the hours of 8:00 am and 5:00 pm, Monday-Friday.  Please follow up sooner if symptoms increase or worsen  Due to recent changes in healthcare laws, you may see the results of your imaging and laboratory studies on MyChart before your provider has had a chance to review them.  We understand that in some cases there may be results that are confusing or concerning to you. Not all  laboratory results come back in the same time frame and the provider may be waiting for multiple results in order to interpret others.  Please give us  48 hours in order for your provider to thoroughly review all the results before contacting the office for clarification of your results.   Thank you for trusting me with your gastrointestinal care!   Ellouise Console, PA-C _______________________________________________________  If your blood pressure at your visit was 140/90 or greater, please contact your primary care physician to follow up on this.  _______________________________________________________  If you are age 67 or older, your body mass index should be between 23-30. Your Body mass index is 32.12 kg/m. If this is out of the aforementioned range listed, please consider follow up with your Primary Care Provider.  If you are age 81 or younger, your body mass index should be between 19-25. Your Body mass index is 32.12 kg/m. If this is out of the aformentioned range listed, please consider follow up with your Primary Care Provider.   ________________________________________________________  The [facility] providers would like to encourage you to use MYCHART to communicate with providers for non-urgent requests or questions.  Due to long hold times on the telephone, sending your provider a message by [facility] may be a faster and more efficient way to get a response.  Please allow 48 business hours for a response.  Please remember that this is  for non-urgent requests.  _______________________________________________________

## 2024-03-19 ENCOUNTER — Ambulatory Visit: Payer: Self-pay | Admitting: Physician Assistant

## 2024-03-19 ENCOUNTER — Other Ambulatory Visit: Payer: Self-pay

## 2024-03-19 DIAGNOSIS — E876 Hypokalemia: Secondary | ICD-10-CM

## 2024-03-19 DIAGNOSIS — Z860100 Personal history of colon polyps, unspecified: Secondary | ICD-10-CM

## 2024-03-19 LAB — UA/M W/RFLX CULTURE, ROUTINE
Bilirubin, UA: NEGATIVE
Glucose, UA: NEGATIVE
Ketones, UA: NEGATIVE
Leukocytes,UA: NEGATIVE
Nitrite, UA: NEGATIVE
Protein,UA: NEGATIVE
RBC, UA: NEGATIVE
Specific Gravity, UA: 1.014 (ref 1.005–1.030)
Urobilinogen, Ur: 0.2 mg/dL (ref 0.2–1.0)
pH, UA: 7 (ref 5.0–7.5)

## 2024-03-19 LAB — MICROSCOPIC EXAMINATION
Bacteria, UA: NONE SEEN
Casts: NONE SEEN /LPF
WBC, UA: NONE SEEN /HPF (ref 0–5)

## 2024-03-26 ENCOUNTER — Ambulatory Visit (HOSPITAL_COMMUNITY)
Admission: RE | Admit: 2024-03-26 | Discharge: 2024-03-26 | Disposition: A | Discharge status: Home / Self Care | Source: Ambulatory Visit | Attending: Physician Assistant | Admitting: Physician Assistant

## 2024-03-26 DIAGNOSIS — K5792 Diverticulitis of intestine, part unspecified, without perforation or abscess without bleeding: Secondary | ICD-10-CM | POA: Diagnosis not present

## 2024-03-26 DIAGNOSIS — R1032 Left lower quadrant pain: Secondary | ICD-10-CM | POA: Diagnosis not present

## 2024-03-26 DIAGNOSIS — Z8719 Personal history of other diseases of the digestive system: Secondary | ICD-10-CM | POA: Insufficient documentation

## 2024-03-26 DIAGNOSIS — K5730 Diverticulosis of large intestine without perforation or abscess without bleeding: Secondary | ICD-10-CM | POA: Diagnosis not present

## 2024-03-26 MED ORDER — IOHEXOL 300 MG/ML  SOLN
100.0000 mL | Freq: Once | INTRAMUSCULAR | Status: AC | PRN
  Administered 2024-03-26: 100 mL via INTRAVENOUS

## 2024-03-26 MED ORDER — IOHEXOL 9 MG/ML PO SOLN
1000.0000 mL | ORAL | Status: AC
  Administered 2024-03-26: 1000 mL via ORAL

## 2024-03-31 ENCOUNTER — Telehealth: Payer: Self-pay | Admitting: Physician Assistant

## 2024-03-31 NOTE — Telephone Encounter (Signed)
 Patient advised that we have not received a reading from radiology on her 7/30 CT scan. I have, however, contacted radiology to ask that they read scan ASAP for ? Diverticulitis.

## 2024-03-31 NOTE — Telephone Encounter (Signed)
 Patient is calling to receive update on CT results. Please advise, Thank you

## 2024-04-01 ENCOUNTER — Ambulatory Visit: Payer: Self-pay | Admitting: Physician Assistant

## 2024-04-01 ENCOUNTER — Other Ambulatory Visit (INDEPENDENT_AMBULATORY_CARE_PROVIDER_SITE_OTHER)

## 2024-04-01 DIAGNOSIS — E876 Hypokalemia: Secondary | ICD-10-CM

## 2024-04-01 LAB — POTASSIUM: Potassium: 3.6 meq/L (ref 3.5–5.1)

## 2024-04-01 MED ORDER — NA SULFATE-K SULFATE-MG SULF 17.5-3.13-1.6 GM/177ML PO SOLN: ORAL | 0 refills | Status: DC

## 2024-04-24 DIAGNOSIS — R070 Pain in throat: Secondary | ICD-10-CM | POA: Diagnosis not present

## 2024-04-24 DIAGNOSIS — R110 Nausea: Secondary | ICD-10-CM | POA: Diagnosis not present

## 2024-04-24 DIAGNOSIS — R059 Cough, unspecified: Secondary | ICD-10-CM | POA: Diagnosis not present

## 2024-04-29 ENCOUNTER — Other Ambulatory Visit (HOSPITAL_COMMUNITY): Payer: Self-pay

## 2024-05-10 ENCOUNTER — Other Ambulatory Visit (HOSPITAL_COMMUNITY): Payer: Self-pay

## 2024-05-16 ENCOUNTER — Other Ambulatory Visit (HOSPITAL_COMMUNITY): Payer: Self-pay

## 2024-05-21 ENCOUNTER — Encounter: Payer: Self-pay | Admitting: Internal Medicine

## 2024-05-22 ENCOUNTER — Telehealth: Payer: Self-pay | Admitting: Internal Medicine

## 2024-05-22 NOTE — Telephone Encounter (Signed)
 Received a call from patient stating she wold like a nurse fu call to discuss prep instructions before procedure scheduled for 10/2. Please review and advise  Thank you

## 2024-05-22 NOTE — Telephone Encounter (Signed)
 Reviewed prep instructions with pt. Pt verbalized she understands instructions given. Instructed to call if has any more questions. Pt states she does have some questions for her MD. Instructed pt to write them down and bring them in the day of the procedure and to tell person coming with her about them as well as tell the RN's who will be helping her get ready for procedure. Pt staes she will. No other questions at this time.

## 2024-05-22 NOTE — Telephone Encounter (Signed)
 Inbound call from patient requesting to speak with a pre visit nurse in regards to what time she would need to start her prep medication for the day before her colonoscopy and what time she needed to take her second dose. Patient is requesting a call back. Patient is scheduled for a colonoscopy on October the 02. Please advise.

## 2024-05-29 ENCOUNTER — Encounter: Payer: Self-pay | Admitting: Gastroenterology

## 2024-05-29 ENCOUNTER — Ambulatory Visit: Admitting: Internal Medicine

## 2024-05-29 ENCOUNTER — Encounter: Payer: Self-pay | Admitting: Internal Medicine

## 2024-05-29 VITALS — BP 128/72 | HR 65 | Temp 98.6°F | Resp 20 | Ht 61.0 in | Wt 168.0 lb

## 2024-05-29 DIAGNOSIS — Z8371 Family history of adenomatous and serrated polyps: Secondary | ICD-10-CM

## 2024-05-29 DIAGNOSIS — I10 Essential (primary) hypertension: Secondary | ICD-10-CM | POA: Diagnosis not present

## 2024-05-29 DIAGNOSIS — Z860101 Personal history of adenomatous and serrated colon polyps: Secondary | ICD-10-CM | POA: Diagnosis not present

## 2024-05-29 DIAGNOSIS — K5730 Diverticulosis of large intestine without perforation or abscess without bleeding: Secondary | ICD-10-CM | POA: Diagnosis not present

## 2024-05-29 DIAGNOSIS — Z1211 Encounter for screening for malignant neoplasm of colon: Secondary | ICD-10-CM | POA: Diagnosis not present

## 2024-05-29 DIAGNOSIS — E785 Hyperlipidemia, unspecified: Secondary | ICD-10-CM | POA: Diagnosis not present

## 2024-05-29 DIAGNOSIS — Z83719 Family history of colon polyps, unspecified: Secondary | ICD-10-CM | POA: Diagnosis not present

## 2024-05-29 DIAGNOSIS — Z860100 Personal history of colon polyps, unspecified: Secondary | ICD-10-CM

## 2024-05-29 MED ORDER — SODIUM CHLORIDE 0.9 % IV SOLN: 500.0000 mL | Freq: Once | INTRAVENOUS | Status: DC

## 2024-05-29 NOTE — Patient Instructions (Signed)
 Resume previous diet Continue present medications There were no colon polyps seen today!  You will need another screening colonoscopy in 5 years, you will receive a letter at that time when you are due for the procedure.   Please call us  at [phone] if you have a change in bowel habits, change in family history of colo-rectal cancer, rectal bleeding or other GI concern before that time.  Handouts/information given for benefiber and diverticulosis   YOU HAD AN ENDOSCOPIC PROCEDURE TODAY AT THE [facility]:   Refer to the procedure report that was given to you for any specific questions about what was found during the examination.  If the procedure report does not answer your questions, please call your gastroenterologist to clarify.  If you requested that your care partner not be given the details of your procedure findings, then the procedure report has been included in a sealed envelope for you to review at your convenience later.  YOU SHOULD EXPECT: Some feelings of bloating in the abdomen. Passage of more gas than usual.  Walking can help get rid of the air that was put into your GI tract during the procedure and reduce the bloating. If you had a lower endoscopy (such as a colonoscopy or flexible sigmoidoscopy) you may notice spotting of blood in your stool or on the toilet paper. If you underwent a bowel prep for your procedure, you may not have a normal bowel movement for a few days.  Please Note:  You might notice some irritation and congestion in your nose or some drainage.  This is from the oxygen used during your procedure.  There is no need for concern and it should clear up in a day or so.  SYMPTOMS TO REPORT IMMEDIATELY:  Following lower endoscopy (colonoscopy):  Excessive amounts of blood in the stool  Significant tenderness or worsening of abdominal pains  Swelling of the abdomen that is new, acute  Fever of 100F or higher For urgent or emergent issues, a  gastroenterologist can be reached at any hour by calling [phone]. Do not use MyChart messaging for urgent concerns.   DIET:  We do recommend a small meal at first, but then you may proceed to your regular diet.  Drink plenty of fluids but you should avoid alcoholic beverages for 24 hours.  ACTIVITY:  You should plan to take it easy for the rest of today and you should NOT DRIVE or use heavy machinery until tomorrow (because of the sedation medicines used during the test).    FOLLOW UP: Our staff will call the number listed on your records the next business day following your procedure.  We will call around 7:15- 8:00 am to check on you and address any questions or concerns that you may have regarding the information given to you following your procedure. If we do not reach you, we will leave a message.     SIGNATURES/CONFIDENTIALITY: You and/or your care partner have signed paperwork which will be entered into your electronic medical record.  These signatures attest to the fact that that the information above on your After Visit Summary has been reviewed and is understood.  Full responsibility of the confidentiality of this discharge information lies with you and/or your care-partner.

## 2024-05-29 NOTE — Progress Notes (Signed)
 Report to PACU, RN, vss, BBS= Clear.

## 2024-05-29 NOTE — Progress Notes (Signed)
 Pt's states no medical or surgical changes since previsit or office visit.

## 2024-05-29 NOTE — Progress Notes (Signed)
 GASTROENTEROLOGY PROCEDURE H&P NOTE   Primary Care Physician: Teresa Channel, MD    Reason for Procedure:  History of adenomatous colon polyps and family history of large adenomatous  Plan:    Colonoscopy  Patient is appropriate for endoscopic procedure(s) in the ambulatory ([facility]) setting.  The nature of the procedure, as well as the risks, benefits, and alternatives were carefully and thoroughly reviewed with the patient. Ample time for discussion and questions allowed. The patient understood, was satisfied, and agreed to proceed.     HPI: Courtney Schmidt is a 73 y.o. female who presents for surveillance colonoscopy.  Medical history as below.  Tolerated the prep.  No recent chest pain or shortness of breath.  No abdominal pain today.  Past Medical History:  Diagnosis Date   Allergy    Arthritis    Colon polyps    Diverticulitis    Family history of adverse reaction to anesthesia    sister,brother-, nephew -hard time waking up from surgery   Gallstones    GERD (gastroesophageal reflux disease)    use TUMS PRN    Hyperlipidemia    Hypertension    Pneumonia     Past Surgical History:  Procedure Laterality Date   CERVICAL SPINE SURGERY  [phone]   1999 fusion   CHOLECYSTECTOMY     COLONOSCOPY  2004,2012   ELBOW SURGERY Right 2024   POLYPECTOMY  7995,7987   h xTA polyps   POSTERIOR CERVICAL FUSION/FORAMINOTOMY Right 05/20/2020   Procedure: FORAMINOTOMY RIGHT CERVICAL SEVEN- THORACIC ONE;  Surgeon: Carollee Lani BROCKS, DO;  Location: [facility] OR;  Service: Neurosurgery;  Laterality: Right;   TUBAL LIGATION      Prior to Admission medications   Medication Sig Start Date End Date Taking? Authorizing Provider  acetaminophen  (TYLENOL ) 500 MG tablet 1 tablet as needed   Yes [provider]  atorvastatin  (LIPITOR) 20 MG tablet Take 1 tablet (20 mg total) by mouth every evening. 12/04/22  Yes   loratadine (CLARITIN) 10 MG tablet Take 10 mg by mouth daily.   Yes [provider]  valsartan -hydrochlorothiazide  (DIOVAN -HCT) 160-12.5 MG tablet Take 1 tablet by mouth daily. 04/20/20  Yes [provider]  valsartan -hydrochlorothiazide  (DIOVAN -HCT) 160-12.5 MG tablet Take 1 tablet by mouth daily for 90 days 02/05/24  Yes   amoxicillin -clavulanate (AUGMENTIN ) 875-125 MG tablet Take 1 tablet by mouth 2 (two) times daily. 02/05/24   [place], PA-C  atorvastatin  (LIPITOR) 20 MG tablet Take 20 mg by mouth daily.    [provider]  atorvastatin  (LIPITOR) 20 MG tablet Take 1 tablet (20 mg total) by mouth every evening. 06/07/23     cyclobenzaprine (FLEXERIL) 10 MG tablet Take 10 mg by mouth daily as needed for pain. 04/15/20   [provider]  fluticasone (FLONASE) 50 MCG/ACT nasal spray Place 1 spray into both nostrils daily as needed for allergies. 04/24/20   [provider]    Current Outpatient Medications  Medication Sig Dispense Refill   acetaminophen  (TYLENOL ) 500 MG tablet 1 tablet as needed     atorvastatin  (LIPITOR) 20 MG tablet Take 1 tablet (20 mg total) by mouth every evening. 90 tablet 3   loratadine (CLARITIN) 10 MG tablet Take 10 mg by mouth daily.     valsartan -hydrochlorothiazide  (DIOVAN -HCT) 160-12.5 MG tablet Take 1 tablet by mouth daily.     valsartan -hydrochlorothiazide  (DIOVAN -HCT) 160-12.5 MG tablet Take 1 tablet by mouth daily for 90 days 90 tablet 1   amoxicillin -clavulanate (AUGMENTIN ) 875-125 MG tablet  Take 1 tablet by mouth 2 (two) times daily. 20 tablet 0   atorvastatin  (LIPITOR) 20 MG tablet Take 20 mg by mouth daily.     atorvastatin  (LIPITOR) 20 MG tablet Take 1 tablet (20 mg total) by mouth every evening. 90 tablet 3   cyclobenzaprine (FLEXERIL) 10 MG tablet Take 10 mg by mouth daily as needed for pain.     fluticasone (FLONASE) 50 MCG/ACT nasal spray Place 1 spray into both nostrils daily as needed for allergies.     Current Facility-Administered Medications  Medication Dose Route Frequency  Provider Last Rate Last Admin   0.9 %  sodium chloride  infusion  500 mL Intravenous Once Neamiah Sciarra, Gordy HERO, MD        Allergies as of 05/29/2024 - Review Complete 05/29/2024  Allergen Reaction Noted   Codeine Nausea And Vomiting 05/23/2012   E-mycin [erythromycin] Nausea And Vomiting 05/23/2012   Sulfa antibiotics Nausea And Vomiting 05/23/2012   Aleve [naproxen] Other (See Comments) 09/28/2020   Cephalexin Other (See Comments) 09/28/2020   Ibuprofen Other (See Comments) 09/28/2020    Family History  Problem Relation Age of Onset   Aneurysm Mother    Heart disease Mother    Pancreatic cancer Father    Heart disease Father    Colon polyps Sister    Heart attack Sister    Colon polyps Brother    Hyperlipidemia Brother    Heart disease Brother    Hyperlipidemia Brother    Diabetes Brother    Diabetes Brother    Hyperlipidemia Brother    Breast cancer Cousin    Crohn's disease Grandchild    Colon cancer Neg Hx    Rectal cancer Neg Hx    Stomach cancer Neg Hx    Esophageal cancer Neg Hx     Social History   Socioeconomic History   Marital status: Married    Spouse name: Not on file   Number of children: 2   Years of education: Not on file   Highest education level: Not on file  Occupational History   Occupation: retired  Tobacco Use   Smoking status: Never   Smokeless tobacco: Never  Vaping Use   Vaping status: Never Used  Substance and Sexual Activity   Alcohol use: No    Alcohol/week: 0.0 standard drinks of alcohol   Drug use: No   Sexual activity: Yes    Birth control/protection: Surgical    Comment: BTL  Other Topics Concern   Not on file  Social History Narrative   Not on file   Social Drivers of Health   Financial Resource Strain: Not on file  Food Insecurity: Not on file  Transportation Needs: Not on file  Physical Activity: Not on file  Stress: Not on file  Social Connections: Not on file  Intimate Partner Violence: Not on file    Physical  Exam: Vital signs in last 24 hours: @BP  134/78   Pulse 83   Temp 98.6 F (37 C) (Temporal)   Ht 5' 1 (1.549 m)   Wt 168 lb (76.2 kg)   SpO2 95%   BMI 31.74 kg/m  GEN: NAD EYE: Sclerae anicteric ENT: MMM CV: Non-tachycardic Pulm: CTA b/l GI: Soft, NT/ND NEURO:  Alert & Oriented x 3   Gordy Starch, MD [facility]  05/29/2024 9:58 AM

## 2024-05-29 NOTE — Op Note (Signed)
 [facility] Patient Name: Courtney Schmidt Procedure Date: 05/29/2024 9:53 AM MRN: [id number] Endoscopist: Gordy CHRISTELLA Starch , MD, [id number] Age: 73 Referring MD:  Date of Birth: 08/28/1951 Gender: Female Account #: [id number] Procedure:                Colonoscopy Indications:              High risk colon cancer surveillance: Personal                            history of multiple adenomas, family hx of advanced                            adenomas in 2 siblings, diverticulitis early this                            year; Last colonoscopy: May 2022 Medicines:                Monitored Anesthesia Care Procedure:                Pre-Anesthesia Assessment:                           - Prior to the procedure, a History and Physical                            was performed, and patient medications and                            allergies were reviewed. The patient's tolerance of                            previous anesthesia was also reviewed. The risks                            and benefits of the procedure and the sedation                            options and risks were discussed with the patient.                            All questions were answered, and informed consent                            was obtained. Prior Anticoagulants: The patient has                            taken no anticoagulant or antiplatelet agents. ASA                            Grade Assessment: II - A patient with mild systemic                            disease. After reviewing the risks and benefits,  the patient was deemed in satisfactory condition to                            undergo the procedure.                           After obtaining informed consent, the colonoscope                            was passed under direct vision. Throughout the                            procedure, the patient's blood pressure, pulse, and                            oxygen saturations were  monitored continuously. The                            Olympus Scope [id number] was introduced through the                            anus and advanced to the cecum, identified by                            appendiceal orifice and ileocecal valve. The                            colonoscopy was performed without difficulty. The                            patient tolerated the procedure well. The quality                            of the bowel preparation was excellent. The                            ileocecal valve, appendiceal orifice, and rectum                            were photographed. Scope In: 10:08:30 AM Scope Out: 10:17:08 AM Scope Withdrawal Time: 0 hours 6 minutes 40 seconds  Total Procedure Duration: 0 hours 8 minutes 38 seconds  Findings:                 The digital rectal exam was normal.                           Multiple small-mouthed diverticula were found in                            the sigmoid colon and descending colon.                           The exam was otherwise without abnormality on  direct and retroflexion views. Complications:            No immediate complications. Estimated Blood Loss:     Estimated blood loss: none. Impression:               - Mild diverticulosis in the sigmoid colon and in                            the descending colon.                           - The examination was otherwise normal on direct                            and retroflexion views.                           - No specimens collected. Recommendation:           - Patient has a contact number available for                            emergencies. The signs and symptoms of potential                            delayed complications were discussed with the                            patient. Return to normal activities tomorrow.                            Written discharge instructions were provided to the                            patient.                            - Resume previous diet.                           - Continue present medications.                           - Benefiber 1 or 2 tablespoons daily can be tried                            for intermittent fecal smearing/incontinence.                           - Repeat colonoscopy in 5 years for surveillance. Gordy CHRISTELLA Starch, MD 05/29/2024 10:20:02 AM This report has been signed electronically.

## 2024-05-29 NOTE — Progress Notes (Signed)
 Patient called this AM. Tolerated yesterday evening preparation but as of this morning only tolerated 1/2 of preparation and then vomited the rest up. She has Dulcolax and will take between 5-15 mg this AM and drink until 6/630 AM to try and be ready for the procedure this AM. She may need enema at the [facility] unit if still not clear and she is aware to still plan to come this AM. Will forward to Dr. Albertus and his team.

## 2024-05-30 ENCOUNTER — Telehealth: Payer: Self-pay | Admitting: Lactation Services

## 2024-05-30 NOTE — Telephone Encounter (Signed)
  Follow up Call-     05/29/2024    9:02 AM  Call back number  Post procedure Call Back phone  # [phone]  Permission to leave phone message Yes     Patient questions:  Do you have a fever, pain , or abdominal swelling? No. Pain Score  0 *  Have you tolerated food without any problems? Yes.    Have you been able to return to your normal activities? Yes.    Do you have any questions about your discharge instructions: Diet   No. Medications  No. Follow up visit  No.  Do you have questions or concerns about your Care? No.  Actions: * If pain score is 4 or above: No action needed, pain <4.

## 2024-06-07 DIAGNOSIS — T23102A Burn of first degree of left hand, unspecified site, initial encounter: Secondary | ICD-10-CM | POA: Diagnosis not present

## 2024-06-18 ENCOUNTER — Other Ambulatory Visit (HOSPITAL_COMMUNITY): Payer: Self-pay

## 2024-06-18 DIAGNOSIS — E785 Hyperlipidemia, unspecified: Secondary | ICD-10-CM | POA: Diagnosis not present

## 2024-06-18 DIAGNOSIS — K862 Cyst of pancreas: Secondary | ICD-10-CM | POA: Diagnosis not present

## 2024-06-18 DIAGNOSIS — E279 Disorder of adrenal gland, unspecified: Secondary | ICD-10-CM | POA: Diagnosis not present

## 2024-06-18 DIAGNOSIS — Z23 Encounter for immunization: Secondary | ICD-10-CM | POA: Diagnosis not present

## 2024-06-18 DIAGNOSIS — I10 Essential (primary) hypertension: Secondary | ICD-10-CM | POA: Diagnosis not present

## 2024-06-18 MED ORDER — VALSARTAN-HYDROCHLOROTHIAZIDE 160-12.5 MG PO TABS
1.0000 | ORAL_TABLET | Freq: Every day | ORAL | 1 refills | Status: AC
  Filled 2024-07-29: qty 90, 90d supply, fill #0

## 2024-06-19 ENCOUNTER — Encounter: Payer: Self-pay | Admitting: Podiatry

## 2024-06-19 ENCOUNTER — Ambulatory Visit: Admitting: Podiatry

## 2024-06-19 VITALS — Ht 61.0 in | Wt 168.0 lb

## 2024-06-19 DIAGNOSIS — L84 Corns and callosities: Secondary | ICD-10-CM | POA: Diagnosis not present

## 2024-06-19 DIAGNOSIS — M216X2 Other acquired deformities of left foot: Secondary | ICD-10-CM

## 2024-06-20 ENCOUNTER — Other Ambulatory Visit (HOSPITAL_COMMUNITY): Payer: Self-pay

## 2024-06-20 NOTE — Progress Notes (Signed)
 Subjective:   Patient ID: Courtney Schmidt, female   DOB: 73 y.o.   MRN: [id number]   HPI Chief Complaint  Patient presents with   Callouses    Pt is here due to callous to the bottom of the left foot.     73 year old female presents the office for above concerns.  She states that she has been doing well but recently the pain started coming back.  She does not report any recent injuries or changes and no open lesions.     Objective:  Physical Exam  General: AAO x3, NAD  Dermatological: Left foot submetatarsal 2 is a hyperkeratotic lesion without any underlying ulceration, drainage or signs of infection.  No open lesions.  Vascular: Dorsalis Pedis artery and Posterior Tibial artery pedal pulses are 2/4 bilateral with immedate capillary fill time. There is no pain with calf compression, swelling, warmth, erythema.   Neruologic: Grossly intact via light touch bilateral.   Musculoskeletal: Prominence of metatarsal head plantarly after debridement.     Assessment:   Hyperkeratotic lesion left foot     Plan:  -Treatment options discussed including all alternatives, risks, and complications -Etiology of symptoms were discussed -Sharply debrided hyperkeratotic lesion with any complications or bleeding.  Discussed moisturizer, offloading.  Unfortunately given the area she is likely prone to reoccurrence.  Discussed she is, good support as well as offloading pads. - Will submit for authorization for future callus debridement given this does cause discomfort and they can cause ulcerations if left untreated. - She has not followed with [facility] for possible orthotic refurbishing.  Return if symptoms worsen or fail to improve.  Donnice JONELLE Fees DPM

## 2024-06-26 ENCOUNTER — Other Ambulatory Visit (HOSPITAL_COMMUNITY): Payer: Self-pay

## 2024-06-30 ENCOUNTER — Other Ambulatory Visit: Payer: Self-pay

## 2024-06-30 ENCOUNTER — Other Ambulatory Visit (HOSPITAL_COMMUNITY): Payer: Self-pay

## 2024-06-30 MED ORDER — ATORVASTATIN CALCIUM 20 MG PO TABS
20.0000 mg | ORAL_TABLET | Freq: Every evening | ORAL | 2 refills | Status: AC
  Filled 2024-06-30: qty 90, 90d supply, fill #0
  Filled 2024-09-24: qty 90, 90d supply, fill #1

## 2024-07-14 DIAGNOSIS — M722 Plantar fascial fibromatosis: Secondary | ICD-10-CM

## 2024-07-18 ENCOUNTER — Telehealth: Payer: Self-pay

## 2024-07-18 NOTE — Telephone Encounter (Signed)
 Orthotics sent to [facility] for re-furb patient owes $90 upon pick  up  Courtney Schmidt

## 2024-07-30 ENCOUNTER — Other Ambulatory Visit: Payer: Self-pay

## 2024-07-30 ENCOUNTER — Other Ambulatory Visit (HOSPITAL_COMMUNITY): Payer: Self-pay

## 2024-08-08 ENCOUNTER — Telehealth: Payer: Self-pay

## 2024-08-08 NOTE — Telephone Encounter (Signed)
 Was calling about Refurbished orthotics that she left here a few weeks ago. I told her they were shipped and that we are waiting for them to be returned.

## 2024-09-08 ENCOUNTER — Telehealth: Payer: Self-pay | Admitting: Podiatry

## 2024-09-08 NOTE — Telephone Encounter (Signed)
 [facility] called about refurbish ortho. Spoke to [place] at [facility]. They are finishing up on them and should be shipped this week. Notified Lyfe and told her we will call when they are ready for pick up.

## 2024-09-15 ENCOUNTER — Telehealth: Payer: Self-pay | Admitting: Podiatry

## 2024-09-15 NOTE — Telephone Encounter (Signed)
 Refurbished orthotics are in [place]. Spoke to [place] and she will stop by the office to pick them up. No appointment needed.

## 2024-09-25 ENCOUNTER — Other Ambulatory Visit: Payer: Self-pay

## 2024-10-27 ENCOUNTER — Ambulatory Visit: Admitting: Physician Assistant
